# Patient Record
Sex: Female | Born: 1945 | Race: White | Hispanic: No | Smoking: Never smoker
Health system: Southern US, Community
[De-identification: ages and names within clinical notes are randomized; demographics above are authoritative.]

## PROBLEM LIST (undated history)

## (undated) DIAGNOSIS — M1990 Unspecified osteoarthritis, unspecified site: Secondary | ICD-10-CM

## (undated) DIAGNOSIS — K5790 Diverticulosis of intestine, part unspecified, without perforation or abscess without bleeding: Secondary | ICD-10-CM

## (undated) DIAGNOSIS — D259 Leiomyoma of uterus, unspecified: Secondary | ICD-10-CM

## (undated) DIAGNOSIS — C801 Malignant (primary) neoplasm, unspecified: Secondary | ICD-10-CM

## (undated) DIAGNOSIS — I639 Cerebral infarction, unspecified: Secondary | ICD-10-CM

## (undated) DIAGNOSIS — N6019 Diffuse cystic mastopathy of unspecified breast: Secondary | ICD-10-CM

## (undated) DIAGNOSIS — E785 Hyperlipidemia, unspecified: Secondary | ICD-10-CM

## (undated) DIAGNOSIS — I10 Essential (primary) hypertension: Secondary | ICD-10-CM

## (undated) HISTORY — PX: COLONOSCOPY: SHX174

## (undated) HISTORY — PX: TUBAL LIGATION: SHX77

## (undated) HISTORY — PX: OTHER SURGICAL HISTORY: SHX169

## (undated) HISTORY — PX: ANKLE ARTHROSCOPY W/ OPEN REPAIR: SHX1145

## (undated) HISTORY — PX: CARPAL TUNNEL RELEASE: SHX101

---

## 2003-10-08 ENCOUNTER — Encounter: Payer: Self-pay | Admitting: Internal Medicine

## 2003-11-08 ENCOUNTER — Encounter: Payer: Self-pay | Admitting: Internal Medicine

## 2003-12-08 ENCOUNTER — Encounter: Payer: Self-pay | Admitting: Internal Medicine

## 2004-01-08 ENCOUNTER — Encounter: Payer: Self-pay | Admitting: Internal Medicine

## 2004-02-08 ENCOUNTER — Encounter: Payer: Self-pay | Admitting: Internal Medicine

## 2004-03-07 ENCOUNTER — Encounter: Payer: Self-pay | Admitting: Internal Medicine

## 2004-03-15 ENCOUNTER — Ambulatory Visit: Payer: Self-pay | Admitting: Internal Medicine

## 2004-04-07 ENCOUNTER — Encounter: Payer: Self-pay | Admitting: Internal Medicine

## 2004-04-19 ENCOUNTER — Ambulatory Visit: Payer: Self-pay | Admitting: Internal Medicine

## 2004-05-07 ENCOUNTER — Encounter: Payer: Self-pay | Admitting: Internal Medicine

## 2004-05-10 ENCOUNTER — Ambulatory Visit: Payer: Self-pay | Admitting: Internal Medicine

## 2004-06-07 ENCOUNTER — Encounter: Payer: Self-pay | Admitting: Internal Medicine

## 2004-07-07 ENCOUNTER — Encounter: Payer: Self-pay | Admitting: Internal Medicine

## 2005-05-16 ENCOUNTER — Ambulatory Visit: Payer: Self-pay | Admitting: Internal Medicine

## 2006-05-20 ENCOUNTER — Ambulatory Visit: Payer: Self-pay | Admitting: Internal Medicine

## 2007-05-26 ENCOUNTER — Ambulatory Visit: Payer: Self-pay | Admitting: Internal Medicine

## 2007-06-02 ENCOUNTER — Ambulatory Visit: Payer: Self-pay | Admitting: Internal Medicine

## 2008-01-29 ENCOUNTER — Emergency Department: Payer: Self-pay | Admitting: Emergency Medicine

## 2008-06-07 ENCOUNTER — Ambulatory Visit: Payer: Self-pay | Admitting: Internal Medicine

## 2009-05-31 ENCOUNTER — Other Ambulatory Visit: Payer: Self-pay | Admitting: Internal Medicine

## 2009-06-01 ENCOUNTER — Ambulatory Visit: Payer: Self-pay | Admitting: Internal Medicine

## 2009-06-14 ENCOUNTER — Ambulatory Visit: Payer: Self-pay | Admitting: Internal Medicine

## 2009-10-07 ENCOUNTER — Ambulatory Visit: Payer: Self-pay | Admitting: Gynecologic Oncology

## 2009-10-10 ENCOUNTER — Ambulatory Visit: Payer: Self-pay | Admitting: Gynecologic Oncology

## 2009-11-07 ENCOUNTER — Ambulatory Visit: Payer: Self-pay | Admitting: Gynecologic Oncology

## 2010-06-11 ENCOUNTER — Inpatient Hospital Stay: Payer: Self-pay | Admitting: Specialist

## 2010-06-18 ENCOUNTER — Ambulatory Visit: Payer: Self-pay | Admitting: Internal Medicine

## 2010-08-22 ENCOUNTER — Ambulatory Visit: Payer: Self-pay | Admitting: Internal Medicine

## 2011-06-05 ENCOUNTER — Other Ambulatory Visit: Payer: Self-pay | Admitting: Internal Medicine

## 2011-06-05 LAB — COMPREHENSIVE METABOLIC PANEL
Alkaline Phosphatase: 74 U/L (ref 50–136)
Anion Gap: 7 (ref 7–16)
Bilirubin,Total: 0.4 mg/dL (ref 0.2–1.0)
Chloride: 104 mmol/L (ref 98–107)
EGFR (African American): >60
EGFR (Non-African Amer.): >60
Potassium: 4.2 mmol/L (ref 3.5–5.1)
SGOT(AST): 25 U/L (ref 15–37)
SGPT (ALT): 24 U/L
Sodium: 140 mmol/L (ref 136–145)
Total Protein: 7.4 g/dL (ref 6.4–8.2)

## 2011-06-05 LAB — CBC WITH DIFFERENTIAL/PLATELET
Basophil %: 2.6 %
HCT: 39 % (ref 35.0–47.0)
HGB: 12.9 g/dL (ref 12.0–16.0)
Lymphocyte #: 1.6 10*3/uL (ref 1.0–3.6)
Lymphocyte %: 31.1 %
MCHC: 33.2 g/dL (ref 32.0–36.0)
MCV: 87 fL (ref 80–100)
Monocyte #: 0.4 x10 3/mm (ref 0.2–0.9)
Neutrophil #: 2.9 10*3/uL (ref 1.4–6.5)
RBC: 4.5 10*6/uL (ref 3.80–5.20)

## 2011-06-05 LAB — LIPID PANEL
Cholesterol: 197 mg/dL (ref 0–200)
Ldl Cholesterol, Calc: 131 mg/dL — ABNORMAL HIGH (ref 0–100)
Triglycerides: 57 mg/dL (ref 0–200)
VLDL Cholesterol, Calc: 11 mg/dL (ref 5–40)

## 2011-06-05 LAB — URINALYSIS, COMPLETE
Bilirubin,UR: NEGATIVE
Glucose,UR: NEGATIVE mg/dL (ref 0–75)
Ph: 6 (ref 4.5–8.0)
Squamous Epithelial: 1
WBC UR: 3 /HPF (ref 0–5)

## 2011-06-05 LAB — TSH: Thyroid Stimulating Horm: 2.14 u[IU]/mL

## 2011-07-08 ENCOUNTER — Ambulatory Visit: Payer: Self-pay | Admitting: Internal Medicine

## 2011-08-23 ENCOUNTER — Ambulatory Visit: Payer: Self-pay | Admitting: Internal Medicine

## 2011-12-25 ENCOUNTER — Ambulatory Visit: Payer: Self-pay | Admitting: Gastroenterology

## 2011-12-26 LAB — PATHOLOGY REPORT

## 2012-08-25 ENCOUNTER — Ambulatory Visit: Payer: Self-pay | Admitting: Internal Medicine

## 2013-09-07 ENCOUNTER — Ambulatory Visit: Payer: Self-pay | Admitting: Internal Medicine

## 2014-08-09 ENCOUNTER — Other Ambulatory Visit: Payer: Self-pay | Admitting: Internal Medicine

## 2014-08-09 DIAGNOSIS — Z1231 Encounter for screening mammogram for malignant neoplasm of breast: Secondary | ICD-10-CM

## 2014-09-13 ENCOUNTER — Ambulatory Visit
Admission: RE | Admit: 2014-09-13 | Discharge: 2014-09-13 | Disposition: A | Discharge status: Home / Self Care | Payer: Medicare HMO | Source: Ambulatory Visit | Attending: Internal Medicine | Admitting: Internal Medicine

## 2014-09-13 ENCOUNTER — Other Ambulatory Visit: Payer: Self-pay | Admitting: Internal Medicine

## 2014-09-13 DIAGNOSIS — Z1231 Encounter for screening mammogram for malignant neoplasm of breast: Secondary | ICD-10-CM

## 2015-08-15 ENCOUNTER — Other Ambulatory Visit: Payer: Self-pay | Admitting: Internal Medicine

## 2015-08-15 DIAGNOSIS — Z1231 Encounter for screening mammogram for malignant neoplasm of breast: Secondary | ICD-10-CM

## 2015-09-19 ENCOUNTER — Ambulatory Visit: Payer: Medicare HMO

## 2015-10-05 ENCOUNTER — Other Ambulatory Visit: Payer: Self-pay | Admitting: Internal Medicine

## 2015-10-05 ENCOUNTER — Ambulatory Visit
Admission: RE | Admit: 2015-10-05 | Discharge: 2015-10-05 | Disposition: A | Discharge status: Home / Self Care | Payer: Medicare HMO | Source: Ambulatory Visit | Attending: Internal Medicine | Admitting: Internal Medicine

## 2015-10-05 DIAGNOSIS — Z1231 Encounter for screening mammogram for malignant neoplasm of breast: Secondary | ICD-10-CM

## 2016-08-13 ENCOUNTER — Other Ambulatory Visit: Payer: Self-pay | Admitting: Internal Medicine

## 2016-08-13 DIAGNOSIS — Z1231 Encounter for screening mammogram for malignant neoplasm of breast: Secondary | ICD-10-CM

## 2016-10-08 ENCOUNTER — Ambulatory Visit
Admission: RE | Admit: 2016-10-08 | Discharge: 2016-10-08 | Disposition: A | Discharge status: Home / Self Care | Payer: Medicare HMO | Source: Ambulatory Visit | Attending: Internal Medicine | Admitting: Internal Medicine

## 2016-10-08 DIAGNOSIS — Z1231 Encounter for screening mammogram for malignant neoplasm of breast: Secondary | ICD-10-CM | POA: Diagnosis present

## 2017-01-22 ENCOUNTER — Ambulatory Visit: Payer: Medicare HMO | Admitting: Certified Registered Nurse Anesthetist

## 2017-01-22 ENCOUNTER — Encounter: Payer: Self-pay | Admitting: *Deleted

## 2017-01-22 ENCOUNTER — Encounter
Admission: RE | Disposition: A | Discharge status: Home / Self Care | Payer: Self-pay | Source: Ambulatory Visit | Attending: Unknown Physician Specialty

## 2017-01-22 ENCOUNTER — Ambulatory Visit
Admission: RE | Admit: 2017-01-22 | Discharge: 2017-01-22 | Disposition: A | Discharge status: Home / Self Care | Payer: Medicare HMO | Source: Ambulatory Visit | Attending: Unknown Physician Specialty | Admitting: Unknown Physician Specialty

## 2017-01-22 DIAGNOSIS — Z7982 Long term (current) use of aspirin: Secondary | ICD-10-CM | POA: Diagnosis not present

## 2017-01-22 DIAGNOSIS — K640 First degree hemorrhoids: Secondary | ICD-10-CM | POA: Diagnosis not present

## 2017-01-22 DIAGNOSIS — Z79899 Other long term (current) drug therapy: Secondary | ICD-10-CM | POA: Insufficient documentation

## 2017-01-22 DIAGNOSIS — Z1211 Encounter for screening for malignant neoplasm of colon: Secondary | ICD-10-CM | POA: Insufficient documentation

## 2017-01-22 DIAGNOSIS — D122 Benign neoplasm of ascending colon: Secondary | ICD-10-CM | POA: Diagnosis not present

## 2017-01-22 DIAGNOSIS — K5730 Diverticulosis of large intestine without perforation or abscess without bleeding: Secondary | ICD-10-CM | POA: Diagnosis not present

## 2017-01-22 DIAGNOSIS — Z8371 Family history of colonic polyps: Secondary | ICD-10-CM | POA: Insufficient documentation

## 2017-01-22 DIAGNOSIS — I10 Essential (primary) hypertension: Secondary | ICD-10-CM | POA: Insufficient documentation

## 2017-01-22 DIAGNOSIS — D128 Benign neoplasm of rectum: Secondary | ICD-10-CM | POA: Diagnosis not present

## 2017-01-22 HISTORY — PX: COLONOSCOPY WITH PROPOFOL: SHX5780

## 2017-01-22 HISTORY — DX: Essential (primary) hypertension: I10

## 2017-01-22 HISTORY — DX: Diverticulosis of intestine, part unspecified, without perforation or abscess without bleeding: K57.90

## 2017-01-22 HISTORY — DX: Leiomyoma of uterus, unspecified: D25.9

## 2017-01-22 HISTORY — DX: Hyperlipidemia, unspecified: E78.5

## 2017-01-22 HISTORY — DX: Diffuse cystic mastopathy of unspecified breast: N60.19

## 2017-01-22 SURGERY — COLONOSCOPY WITH PROPOFOL
Anesthesia: General

## 2017-01-22 MED ORDER — SODIUM CHLORIDE 0.9 % IV SOLN: INTRAVENOUS | Status: DC

## 2017-01-22 MED ORDER — PROPOFOL 10 MG/ML IV BOLUS
INTRAVENOUS | Status: DC | PRN
  Administered 2017-01-22: 425 mg via INTRAVENOUS

## 2017-01-22 MED ORDER — LIDOCAINE HCL (CARDIAC) 20 MG/ML IV SOLN
INTRAVENOUS | Status: DC | PRN
  Administered 2017-01-22: 50 mg via INTRAVENOUS

## 2017-01-22 MED ORDER — SODIUM CHLORIDE 0.9 % IV SOLN
INTRAVENOUS | Status: DC
  Administered 2017-01-22: 1000 mL via INTRAVENOUS

## 2017-01-22 MED ORDER — LIDOCAINE HCL (PF) 2 % IJ SOLN
INTRAMUSCULAR | Status: AC
  Filled 2017-01-22: qty 10

## 2017-01-22 MED ORDER — PROPOFOL 500 MG/50ML IV EMUL
INTRAVENOUS | Status: AC
  Filled 2017-01-22: qty 50

## 2017-01-22 NOTE — Anesthesia Post-op Follow-up Note (Signed)
Anesthesia QCDR form completed.        

## 2017-01-22 NOTE — Anesthesia Postprocedure Evaluation (Signed)
Anesthesia Post Note  Patient: Karina Hoffman  Procedure(s) Performed: COLONOSCOPY WITH PROPOFOL (N/A )  Patient location during evaluation: Endoscopy Anesthesia Type: General Level of consciousness: awake and alert and oriented Pain management: pain level controlled Vital Signs Assessment: post-procedure vital signs reviewed and stable Respiratory status: spontaneous breathing, nonlabored ventilation and respiratory function stable Cardiovascular status: blood pressure returned to baseline and stable Postop Assessment: no signs of nausea or vomiting Anesthetic complications: no     Last Vitals:  Vitals:   01/22/17 1528 01/22/17 1538  BP:  132/70  Pulse:    Resp:    Temp:    SpO2: 97%     Last Pain:  Vitals:   01/22/17 1508  TempSrc: Tympanic                 Kelcie Currie

## 2017-01-22 NOTE — Op Note (Signed)
[facility] Patient Name: Karina Hoffman Procedure Date: 01/22/2017 2:23 PM MRN: [id number] Account #: [id number] Date of Birth: 10-Sep-1945 Admit Type: Outpatient Age: 72 Room: [facility] ENDO ROOM 4 Gender: Female Note Status: Finalized Procedure:            Colonoscopy Indications:          Colon cancer screening in patient at increased risk:                        Family history of 1st-degree relative with colon polyps Providers:            Manya Silvas, MD Referring MD:         Leonie Douglas. Doy Hutching, MD (Referring MD) Medicines:            Propofol per Anesthesia Complications:        No immediate complications. Procedure:            Pre-Anesthesia Assessment:                       - After reviewing the risks and benefits, the patient                        was deemed in satisfactory condition to undergo the                        procedure.                       After obtaining informed consent, the colonoscope was                        passed under direct vision. Throughout the procedure,                        the patient's blood pressure, pulse, and oxygen                        saturations were monitored continuously. The                        Colonoscope was introduced through the anus and                        advanced to the the cecum, identified by appendiceal                        orifice and ileocecal valve. The colonoscopy was                        technically difficult and complex due to multiple                        diverticula in the colon, restricted mobility of the                        colon, significant looping and a tortuous colon.                        Successful completion of the procedure was aided by  applying abdominal pressure. The patient tolerated the                        procedure well. The quality of the bowel preparation                        was good. Findings:      Multiple small  and large-mouthed diverticula were found in the       recto-sigmoid colon, sigmoid colon, descending colon, transverse colon       and ascending colon.      A small-medium polyp was found in the ascending colon. The polyp was       sessile. The polyp was removed with a hot snare. Resection and retrieval       were complete. One clip placed on the divot with great position.      A small polyp was found in the rectum. The polyp was sessile. The polyp       was removed with a hot snare. Resection and retrieval were complete.      Internal hemorrhoids were found during endoscopy. The hemorrhoids were       small and Grade I (internal hemorrhoids that do not prolapse). Impression:           - Diverticulosis in the recto-sigmoid colon, in the                        sigmoid colon, in the descending colon, in the                        transverse colon and in the ascending colon.                       - One medium polyp in the ascending colon, removed with                        a hot snare. Resected and retrieved.                       - One small polyp in the rectum, removed with a hot                        snare. Resected and retrieved.                       - Internal hemorrhoids. Recommendation:       - Await pathology results. Manya Silvas, MD 01/22/2017 3:10:53 PM This report has been signed electronically. Number of Addenda: 0 Note Initiated On: 01/22/2017 2:23 PM Scope Withdrawal Time: 0 hours 17 minutes 36 seconds  Total Procedure Duration: 0 hours 38 minutes 40 seconds       [facility]

## 2017-01-22 NOTE — H&P (Signed)
   Primary Care Physician:  Idelle Crouch, MD Primary Gastroenterologist:  Dr. Vira Agar  Pre-Procedure History & Physical: HPI:  Karina Hoffman is a 72 y.o. female is here for an colonoscopy.   Past Medical History:  Diagnosis Date  . Diverticulosis   . Fibrocystic breast disease   . Hyperlipidemia   . Hypertension   . Uterine fibroid     Past Surgical History:  Procedure Laterality Date  . ANKLE ARTHROSCOPY W/ OPEN REPAIR    . COLONOSCOPY    . TUBAL LIGATION      Prior to Admission medications   Medication Sig Start Date End Date Taking? Authorizing Provider  aspirin EC 81 MG tablet Take 81 mg by mouth daily.   Yes [provider]  calcium carbonate 1250 MG capsule Take 1,250 mg by mouth daily.   Yes [provider]  folic acid (FOLVITE) 1 MG tablet Take 1 mg by mouth daily.   Yes [provider]  losartan (COZAAR) 50 MG tablet Take 50 mg by mouth daily.   Yes [provider]    Allergies as of 01/21/2017  . (Not on File)    Family History  Problem Relation Age of Onset  . Breast cancer Neg Hx     Social History   Socioeconomic History  . Marital status: Widowed    Spouse name: Not on file  . Number of children: Not on file  . Years of education: Not on file  . Highest education level: Not on file  Social Needs  . Financial resource strain: Not on file  . Food insecurity - worry: Not on file  . Food insecurity - inability: Not on file  . Transportation needs - medical: Not on file  . Transportation needs - non-medical: Not on file  Occupational History  . Not on file  Tobacco Use  . Smoking status: Never Smoker  . Smokeless tobacco: Never Used  Substance and Sexual Activity  . Alcohol use: Not on file  . Drug use: Not on file  . Sexual activity: Not on file  Other Topics Concern  . Not on file  Social History Narrative  . Not on file    Review of Systems: See HPI, otherwise negative ROS  Physical Exam: BP  140/86   Pulse (!) 106   Temp (!) 97.3 F (36.3 C) (Tympanic)   Resp 16   Ht 5\' 2"  (1.575 m)   Wt 64.4 kg (142 lb)   SpO2 99%   BMI 25.97 kg/m  General:   Alert,  pleasant and cooperative in NAD Head:  Normocephalic and atraumatic. Neck:  Supple; no masses or thyromegaly. Lungs:  Clear throughout to auscultation.    Heart:  Regular rate and rhythm. Abdomen:  Soft, nontender and nondistended. Normal bowel sounds, without guarding, and without rebound.   Neurologic:  Alert and  oriented x4;  grossly normal neurologically.  Impression/Plan: Karina Hoffman is here for an colonoscopy to be performed for FH of colon polyps.  Risks, benefits, limitations, and alternatives regarding  colonoscopy have been reviewed with the patient.  Questions have been answered.  All parties agreeable.   Gaylyn Cheers, MD  01/22/2017, 2:20 PM

## 2017-01-22 NOTE — Anesthesia Preprocedure Evaluation (Signed)
Anesthesia Evaluation  Patient identified by MRN, date of birth, ID band Patient awake    Reviewed: Allergy & Precautions, H&P , NPO status , Patient's Chart, lab work & pertinent test results, reviewed documented beta blocker date and time   History of Anesthesia Complications Negative for: history of anesthetic complications  Airway Mallampati: I  TM Distance: >3 FB Neck ROM: full    Dental  (+) Dental Advidsory Given Permanent bridge x 2:   Pulmonary neg pulmonary ROS,           Cardiovascular Exercise Tolerance: Good hypertension, (-) angina(-) CAD, (-) Past MI, (-) Cardiac Stents and (-) CABG (-) dysrhythmias (-) Valvular Problems/Murmurs     Neuro/Psych negative neurological ROS  negative psych ROS   GI/Hepatic negative GI ROS, Neg liver ROS,   Endo/Other  negative endocrine ROS  Renal/GU negative Renal ROS  negative genitourinary   Musculoskeletal   Abdominal   Peds  Hematology negative hematology ROS (+)   Anesthesia Other Findings Past Medical History: No date: Diverticulosis No date: Fibrocystic breast disease No date: Hyperlipidemia No date: Hypertension No date: Uterine fibroid   Reproductive/Obstetrics negative OB ROS                             Anesthesia Physical Anesthesia Plan  ASA: II  Anesthesia Plan: General   Post-op Pain Management:    Induction: Intravenous  PONV Risk Score and Plan: 3 and Propofol infusion  Airway Management Planned: Nasal Cannula  Additional Equipment:   Intra-op Plan:   Post-operative Plan:   Informed Consent: I have reviewed the patients History and Physical, chart, labs and discussed the procedure including the risks, benefits and alternatives for the proposed anesthesia with the patient or authorized representative who has indicated his/her understanding and acceptance.   Dental Advisory Given  Plan Discussed with:  Anesthesiologist, CRNA and Surgeon  Anesthesia Plan Comments:         Anesthesia Quick Evaluation

## 2017-01-22 NOTE — Transfer of Care (Signed)
Immediate Anesthesia Transfer of Care Note  Patient: Karina Hoffman  Procedure(s) Performed: COLONOSCOPY WITH PROPOFOL (N/A )  Patient Location: PACU and Endoscopy Unit  Anesthesia Type:MAC  Level of Consciousness: awake, alert , oriented and patient cooperative  Airway & Oxygen Therapy: Patient Spontanous Breathing and Patient connected to nasal cannula oxygen  Post-op Assessment: Report given to RN and Post -op Vital signs reviewed and stable  Post vital signs: Reviewed and stable  Last Vitals:  Vitals:   01/22/17 1340 01/22/17 1508  BP: 140/86 115/70  Pulse: (!) 106   Resp: 16 16  Temp: (!) 36.3 C (!) 36.2 C  SpO2: 99% 99%    Last Pain:  Vitals:   01/22/17 1508  TempSrc: Tympanic         Complications: No apparent anesthesia complications

## 2017-01-23 ENCOUNTER — Encounter: Payer: Self-pay | Admitting: Unknown Physician Specialty

## 2017-01-24 LAB — SURGICAL PATHOLOGY

## 2017-08-28 ENCOUNTER — Other Ambulatory Visit: Payer: Self-pay | Admitting: Internal Medicine

## 2017-08-28 DIAGNOSIS — Z1231 Encounter for screening mammogram for malignant neoplasm of breast: Secondary | ICD-10-CM

## 2017-10-30 ENCOUNTER — Ambulatory Visit
Admission: RE | Admit: 2017-10-30 | Discharge: 2017-10-30 | Disposition: A | Discharge status: Home / Self Care | Payer: Medicare HMO | Source: Ambulatory Visit | Attending: Internal Medicine | Admitting: Internal Medicine

## 2017-10-30 DIAGNOSIS — Z1231 Encounter for screening mammogram for malignant neoplasm of breast: Secondary | ICD-10-CM | POA: Insufficient documentation

## 2018-10-20 ENCOUNTER — Other Ambulatory Visit: Payer: Self-pay | Admitting: Internal Medicine

## 2018-10-20 DIAGNOSIS — Z1231 Encounter for screening mammogram for malignant neoplasm of breast: Secondary | ICD-10-CM

## 2019-01-14 ENCOUNTER — Ambulatory Visit
Admission: RE | Admit: 2019-01-14 | Discharge: 2019-01-14 | Disposition: A | Discharge status: Home / Self Care | Payer: Medicare HMO | Source: Ambulatory Visit | Attending: Internal Medicine | Admitting: Internal Medicine

## 2019-01-14 DIAGNOSIS — Z1231 Encounter for screening mammogram for malignant neoplasm of breast: Secondary | ICD-10-CM | POA: Diagnosis present

## 2019-01-14 HISTORY — DX: Malignant (primary) neoplasm, unspecified: C80.1

## 2019-12-07 ENCOUNTER — Other Ambulatory Visit: Payer: Self-pay | Admitting: Internal Medicine

## 2019-12-07 DIAGNOSIS — Z1231 Encounter for screening mammogram for malignant neoplasm of breast: Secondary | ICD-10-CM

## 2020-01-18 ENCOUNTER — Other Ambulatory Visit: Payer: Self-pay

## 2020-01-18 ENCOUNTER — Ambulatory Visit
Admission: RE | Admit: 2020-01-18 | Discharge: 2020-01-18 | Disposition: A | Discharge status: Home / Self Care | Payer: Medicare HMO | Source: Ambulatory Visit | Attending: Internal Medicine | Admitting: Internal Medicine

## 2020-01-18 DIAGNOSIS — Z1231 Encounter for screening mammogram for malignant neoplasm of breast: Secondary | ICD-10-CM | POA: Diagnosis not present

## 2020-04-06 ENCOUNTER — Other Ambulatory Visit: Payer: Medicare HMO

## 2020-04-10 ENCOUNTER — Ambulatory Visit: Admit: 2020-04-10 | Payer: Medicare HMO | Admitting: Internal Medicine

## 2020-04-10 SURGERY — COLONOSCOPY WITH PROPOFOL
Anesthesia: General

## 2020-05-15 ENCOUNTER — Other Ambulatory Visit: Admission: RE | Admit: 2020-05-15 | Payer: Medicare HMO | Source: Ambulatory Visit

## 2020-08-18 ENCOUNTER — Other Ambulatory Visit: Payer: Self-pay | Admitting: Internal Medicine

## 2020-08-18 DIAGNOSIS — H539 Unspecified visual disturbance: Secondary | ICD-10-CM

## 2020-08-26 ENCOUNTER — Ambulatory Visit
Admission: RE | Admit: 2020-08-26 | Discharge: 2020-08-26 | Disposition: A | Discharge status: Home / Self Care | Payer: Medicare HMO | Source: Ambulatory Visit | Attending: Internal Medicine | Admitting: Internal Medicine

## 2020-08-26 ENCOUNTER — Other Ambulatory Visit: Payer: Self-pay

## 2020-08-26 DIAGNOSIS — H539 Unspecified visual disturbance: Secondary | ICD-10-CM

## 2020-08-26 DIAGNOSIS — I69398 Other sequelae of cerebral infarction: Secondary | ICD-10-CM | POA: Insufficient documentation

## 2020-08-29 ENCOUNTER — Encounter: Payer: Self-pay | Admitting: Internal Medicine

## 2020-08-30 ENCOUNTER — Encounter
Admission: RE | Disposition: A | Discharge status: Home / Self Care | Payer: Self-pay | Source: Home / Self Care | Attending: Internal Medicine

## 2020-08-30 ENCOUNTER — Encounter: Payer: Self-pay | Admitting: Internal Medicine

## 2020-08-30 ENCOUNTER — Other Ambulatory Visit: Payer: Self-pay

## 2020-08-30 ENCOUNTER — Ambulatory Visit: Payer: Medicare HMO | Admitting: Certified Registered Nurse Anesthetist

## 2020-08-30 ENCOUNTER — Ambulatory Visit
Admission: RE | Admit: 2020-08-30 | Discharge: 2020-08-30 | Disposition: A | Discharge status: Home / Self Care | Payer: Medicare HMO | Attending: Internal Medicine | Admitting: Internal Medicine

## 2020-08-30 DIAGNOSIS — Z79899 Other long term (current) drug therapy: Secondary | ICD-10-CM | POA: Insufficient documentation

## 2020-08-30 DIAGNOSIS — K5730 Diverticulosis of large intestine without perforation or abscess without bleeding: Secondary | ICD-10-CM | POA: Diagnosis not present

## 2020-08-30 DIAGNOSIS — Z86010 Personal history of colonic polyps: Secondary | ICD-10-CM | POA: Diagnosis not present

## 2020-08-30 DIAGNOSIS — Z7982 Long term (current) use of aspirin: Secondary | ICD-10-CM | POA: Insufficient documentation

## 2020-08-30 DIAGNOSIS — Z1211 Encounter for screening for malignant neoplasm of colon: Secondary | ICD-10-CM | POA: Insufficient documentation

## 2020-08-30 HISTORY — PX: COLONOSCOPY: SHX5424

## 2020-08-30 HISTORY — DX: Cerebral infarction, unspecified: I63.9

## 2020-08-30 HISTORY — DX: Unspecified osteoarthritis, unspecified site: M19.90

## 2020-08-30 SURGERY — COLONOSCOPY
Anesthesia: General

## 2020-08-30 MED ORDER — PROPOFOL 500 MG/50ML IV EMUL
INTRAVENOUS | Status: DC | PRN
  Administered 2020-08-30: 140 ug/kg/min via INTRAVENOUS

## 2020-08-30 MED ORDER — PROPOFOL 10 MG/ML IV BOLUS
INTRAVENOUS | Status: DC | PRN
  Administered 2020-08-30: 50 mg via INTRAVENOUS
  Administered 2020-08-30: 30 mg via INTRAVENOUS

## 2020-08-30 MED ORDER — SODIUM CHLORIDE 0.9 % IV SOLN: INTRAVENOUS | Status: DC

## 2020-08-30 NOTE — Transfer of Care (Signed)
Immediate Anesthesia Transfer of Care Note  Patient: Karina Hoffman  Procedure(s) Performed: COLONOSCOPY  Patient Location: PACU  Anesthesia Type:General  Level of Consciousness: drowsy  Airway & Oxygen Therapy: Patient Spontanous Breathing and Patient connected to nasal cannula oxygen  Post-op Assessment: Report given to RN and Post -op Vital signs reviewed and stable  Post vital signs: Reviewed and stable  Last Vitals:  Vitals Value Taken Time  BP 103/77 08/30/20 1307  Temp 35.9 C 08/30/20 1307  Pulse 57 08/30/20 1310  Resp 15 08/30/20 1310  SpO2 99 % 08/30/20 1310  Vitals shown include unvalidated device data.  Last Pain:  Vitals:   08/30/20 1307  TempSrc:   PainSc: Asleep         Complications: No notable events documented.

## 2020-08-30 NOTE — Op Note (Signed)
[facility] Patient Name: Karina Hoffman Procedure Date: 08/30/2020 12:30 PM MRN: [id number] Account #: [id number] Date of Birth: 1945-02-18 Admit Type: Outpatient Age: 75 Room: [facility] ENDO ROOM 2 Gender: Female Note Status: Finalized Procedure:             Colonoscopy Indications:           Surveillance: Personal history of adenomatous polyps                         on last colonoscopy > 3 years ago Providers:             Lorie Apley K. Alice Reichert MD, MD Referring MD:          Leonie Douglas. Doy Hutching, MD (Referring MD) Medicines:             Propofol per Anesthesia Complications:         No immediate complications. Procedure:             Pre-Anesthesia Assessment:                        - The risks and benefits of the procedure and the                         sedation options and risks were discussed with the                         patient. All questions were answered and informed                         consent was obtained.                        - Patient identification and proposed procedure were                         verified prior to the procedure by the nurse. The                         procedure was verified in the procedure room.                        - ASA Grade Assessment: III - A patient with severe                         systemic disease.                        - After reviewing the risks and benefits, the patient                         was deemed in satisfactory condition to undergo the                         procedure.                        After obtaining informed consent, the colonoscope was                         passed under direct  vision. Throughout the procedure,                         the patient's blood pressure, pulse, and oxygen                         saturations were monitored continuously. The                         Colonoscope was introduced through the anus and                         advanced to the the cecum,  identified by appendiceal                         orifice and ileocecal valve. The colonoscopy was                         somewhat difficult due to multiple diverticula in the                         colon and restricted mobility of the colon. Successful                         completion of the procedure was aided by straightening                         and shortening the scope to obtain bowel loop                         reduction. The patient tolerated the procedure well.                         The quality of the bowel preparation was good. The                         ileocecal valve, appendiceal orifice, and rectum were                         photographed. Findings:      The perianal and digital rectal examinations were normal. Pertinent       negatives include normal sphincter tone and no palpable rectal lesions.      Multiple small and large-mouthed diverticula were found in the sigmoid       colon. There was no evidence of diverticular bleeding.      A few small-mouthed diverticula were found in the descending colon,       transverse colon and ascending colon. There was no evidence of       diverticular bleeding.      The exam was otherwise without abnormality on direct and retroflexion       views. Impression:            - Severe diverticulosis in the sigmoid colon. There                         was no evidence of diverticular bleeding.                        -  Mild diverticulosis in the descending colon, in the                         transverse colon and in the ascending colon. There was                         no evidence of diverticular bleeding.                        - The examination was otherwise normal on direct and                         retroflexion views.                        - No specimens collected. Recommendation:        - Patient has a contact number available for                         emergencies. The signs and symptoms of potential                          delayed complications were discussed with the patient.                         Return to normal activities tomorrow. Written                         discharge instructions were provided to the patient.                        - High fiber diet.                        - Continue present medications.                        - You do NOT require further colon cancer screening                         measures (Annual stool testing (i.e. hemoccult, FIT,                         cologuard), sigmoidoscopy, colonoscopy or CT                         colonography). You should share this recommendation                         with your Primary Care provider.                        - Return to GI office PRN. Procedure Code(s):     --- Professional ---                        [id number], Colorectal cancer screening; colonoscopy on                         individual at high risk Diagnosis Code(s):     ---  Professional ---                        K57.30, Diverticulosis of large intestine without                         perforation or abscess without bleeding                        Z86.010, Personal history of colonic polyps CPT copyright 2019 American Medical Association. All rights reserved. The codes documented in this report are preliminary and upon coder review may  be revised to meet current compliance requirements. Efrain Sella MD, MD 08/30/2020 1:09:54 PM This report has been signed electronically. Number of Addenda: 0 Note Initiated On: 08/30/2020 12:30 PM Scope Withdrawal Time: 0 hours 6 minutes 2 seconds  Total Procedure Duration: 0 hours 14 minutes 11 seconds  Estimated Blood Loss:  Estimated blood loss: none.      [employer]

## 2020-08-30 NOTE — H&P (Signed)
Outpatient short stay form Pre-procedure 08/30/2020 12:23 PM Darol Cush K. Alice Reichert, M.D.  Primary Physician: Fulton Reek, M.D.  Reason for visit:  Personal history of adenomatous colon polyps (2019)  History of present illness:                            Patient presents for colonoscopy for a personal hx of colon polyps. The patient denies abdominal pain, abnormal weight loss or rectal bleeding.      Current Facility-Administered Medications:    0.9 %  sodium chloride infusion, , Intravenous, Continuous, Fairley Copher, Benay Pike, MD  Medications Prior to Admission  Medication Sig Dispense Refill Last Dose   aspirin EC 81 MG tablet Take 81 mg by mouth daily.   08/29/2020   calcium carbonate 1250 MG capsule Take 1,250 mg by mouth daily.   [id number]   folic acid (FOLVITE) 1 MG tablet Take 1 mg by mouth daily.   08/28/2020   losartan (COZAAR) 50 MG tablet Take 50 mg by mouth daily.   08/30/2020 at 0600   traZODone (DESYREL) 50 MG tablet Take 50 mg by mouth at bedtime.   08/29/2020 at 2100   telmisartan (MICARDIS) 80 MG tablet Take 80 mg by mouth daily. (Patient not taking: Reported on 08/30/2020)   Not Taking     No Known Allergies   Past Medical History:  Diagnosis Date   Arthritis    Cancer ([place])    Melanoma   Diverticulosis    Fibrocystic breast disease    Hyperlipidemia    Hypertension    Stroke [facility])    Uterine fibroid     Review of systems:  Otherwise negative.    Physical Exam  Gen: Alert, oriented. Appears stated age.  HEENT: [place]/AT. PERRLA. Lungs: CTA, no wheezes. CV: RR nl S1, S2. Abd: soft, benign, no masses. BS+ Ext: No edema. Pulses 2+    Planned procedures: Proceed with colonoscopy. The patient understands the nature of the planned procedure, indications, risks, alternatives and potential complications including but not limited to bleeding, infection, perforation, damage to internal organs and possible oversedation/side effects from anesthesia. The patient agrees  and gives consent to proceed.  Please refer to procedure notes for findings, recommendations and patient disposition/instructions.     Romolo Sieling K. Alice Reichert, M.D. Gastroenterology 08/30/2020  12:23 PM

## 2020-08-30 NOTE — Anesthesia Preprocedure Evaluation (Signed)
Anesthesia Evaluation  Patient identified by MRN, date of birth, ID band Patient awake    Reviewed: Allergy & Precautions, H&P , NPO status , Patient's Chart, lab work & pertinent test results, reviewed documented beta blocker date and time   History of Anesthesia Complications Negative for: history of anesthetic complications  Airway Mallampati: I  TM Distance: >3 FB Neck ROM: full    Dental  (+) Dental Advidsory Given Permanent bridge x 2:   Pulmonary neg pulmonary ROS,    Pulmonary exam normal        Cardiovascular Exercise Tolerance: Good hypertension, (-) angina(-) CAD, (-) Past MI, (-) Cardiac Stents and (-) CABG Normal cardiovascular exam(-) dysrhythmias (-) Valvular Problems/Murmurs     Neuro/Psych 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), Residual Symptoms negative psych ROS   GI/Hepatic negative GI ROS, Neg liver ROS,   Endo/Other  negative endocrine ROS  Renal/GU negative Renal ROS  negative genitourinary   Musculoskeletal   Abdominal Normal abdominal exam  (+)   Peds  Hematology negative hematology ROS (+)   Anesthesia Other Findings Past Medical History: No date: Diverticulosis No date: Fibrocystic breast disease No date: Hyperlipidemia No date: Hypertension No date: Uterine fibroid   Reproductive/Obstetrics negative OB ROS                             Anesthesia Physical  Anesthesia Plan  ASA: 3  Anesthesia Plan: General   Post-op Pain Management:    Induction: Intravenous  PONV Risk Score and Plan: 3 and Propofol infusion  Airway Management Planned: Nasal Cannula  Additional Equipment:   Intra-op Plan:   Post-operative Plan:   Informed Consent: I have reviewed the patients History and Physical, chart, labs and discussed the procedure including the risks, benefits and alternatives for the proposed anesthesia with the patient or authorized  representative who has indicated his/her understanding and acceptance.     Dental Advisory Given  Plan Discussed with: Anesthesiologist, CRNA and Surgeon  Anesthesia Plan Comments:         Anesthesia Quick Evaluation

## 2020-08-30 NOTE — Interval H&P Note (Signed)
History and Physical Interval Note:  08/30/2020 12:24 PM  Karina Hoffman  has presented today for surgery, with the diagnosis of PERSONEL HX.OF COLON POLYPS.  The various methods of treatment have been discussed with the patient and family. After consideration of risks, benefits and other options for treatment, the patient has consented to  Procedure(s): COLONOSCOPY (N/A) as a surgical intervention.  The patient's history has been reviewed, patient examined, no change in status, stable for surgery.  I have reviewed the patient's chart and labs.  Questions were answered to the patient's satisfaction.     Primera, Bonnie

## 2020-08-31 ENCOUNTER — Encounter: Payer: Self-pay | Admitting: Internal Medicine

## 2020-08-31 NOTE — Anesthesia Postprocedure Evaluation (Signed)
Anesthesia Post Note  Patient: Karina Hoffman  Procedure(s) Performed: COLONOSCOPY  Patient location during evaluation: Endoscopy Anesthesia Type: General Level of consciousness: awake and alert Pain management: pain level controlled Vital Signs Assessment: post-procedure vital signs reviewed and stable Respiratory status: spontaneous breathing, nonlabored ventilation and respiratory function stable Cardiovascular status: blood pressure returned to baseline and stable Postop Assessment: no apparent nausea or vomiting Anesthetic complications: no   No notable events documented.   Last Vitals:  Vitals:   08/30/20 1327 08/30/20 1337  BP: (!) 151/84 (!) 150/87  Pulse: (!) 56 66  Resp: 15 17  Temp:    SpO2: 100% 100%    Last Pain:  Vitals:   08/31/20 0759  TempSrc:   PainSc: 0-No pain                 [place] Ouch

## 2020-12-13 ENCOUNTER — Other Ambulatory Visit: Payer: Self-pay | Admitting: Internal Medicine

## 2020-12-13 DIAGNOSIS — Z1231 Encounter for screening mammogram for malignant neoplasm of breast: Secondary | ICD-10-CM

## 2021-01-19 ENCOUNTER — Other Ambulatory Visit: Payer: Self-pay

## 2021-01-19 ENCOUNTER — Ambulatory Visit
Admission: RE | Admit: 2021-01-19 | Discharge: 2021-01-19 | Disposition: A | Discharge status: Home / Self Care | Payer: Medicare HMO | Source: Ambulatory Visit | Attending: Internal Medicine | Admitting: Internal Medicine

## 2021-01-19 DIAGNOSIS — Z1231 Encounter for screening mammogram for malignant neoplasm of breast: Secondary | ICD-10-CM | POA: Diagnosis present

## 2021-10-22 ENCOUNTER — Ambulatory Visit: Payer: Medicare HMO | Attending: Orthopedic Surgery | Admitting: Physical Therapy

## 2021-10-22 DIAGNOSIS — M25662 Stiffness of left knee, not elsewhere classified: Secondary | ICD-10-CM | POA: Diagnosis present

## 2021-10-22 DIAGNOSIS — M6281 Muscle weakness (generalized): Secondary | ICD-10-CM | POA: Insufficient documentation

## 2021-10-22 DIAGNOSIS — M25562 Pain in left knee: Secondary | ICD-10-CM | POA: Diagnosis present

## 2021-10-22 DIAGNOSIS — G8929 Other chronic pain: Secondary | ICD-10-CM | POA: Diagnosis present

## 2021-10-29 ENCOUNTER — Ambulatory Visit: Payer: Medicare HMO | Admitting: Physical Therapy

## 2021-10-29 DIAGNOSIS — M25662 Stiffness of left knee, not elsewhere classified: Secondary | ICD-10-CM

## 2021-10-29 DIAGNOSIS — M25562 Pain in left knee: Secondary | ICD-10-CM | POA: Diagnosis not present

## 2021-10-29 DIAGNOSIS — M6281 Muscle weakness (generalized): Secondary | ICD-10-CM

## 2021-10-29 DIAGNOSIS — G8929 Other chronic pain: Secondary | ICD-10-CM

## 2021-10-29 NOTE — Addendum Note (Signed)
Addended by: Pura Spice on: 10/29/2021 07:12 PM   Modules accepted: Orders

## 2021-10-29 NOTE — Therapy (Signed)
OUTPATIENT PHYSICAL THERAPY LOWER EXTREMITY EVALUATION   Patient Name: Karina Hoffman MRN: [id number] DOB:[birth year], 76 y.o., female Today's Date: 10/22/21   PT End of Session - 10/29/21 1843     Visit Number 1    Number of Visits 13    Date for PT Re-Evaluation 12/03/21    PT Start Time 0939    PT Stop Time 1031    PT Time Calculation (min) 52 min             Past Medical History:  Diagnosis Date   Arthritis    Cancer ([place])    Melanoma   Diverticulosis    Fibrocystic breast disease    Hyperlipidemia    Hypertension    Stroke [facility])    Uterine fibroid    Past Surgical History:  Procedure Laterality Date   ANKLE ARTHROSCOPY W/ OPEN REPAIR     CARPAL TUNNEL RELEASE Right    COLONOSCOPY     COLONOSCOPY N/A 08/30/2020   Procedure: COLONOSCOPY;  Surgeon: Toledo, Benay Pike, MD;  Location: [facility] ENDOSCOPY;  Service: Gastroenterology;  Laterality: N/A;   COLONOSCOPY WITH PROPOFOL N/A 01/22/2017   Procedure: COLONOSCOPY WITH PROPOFOL;  Surgeon: Manya Silvas, MD;  Location: [facility] ENDOSCOPY;  Service: Endoscopy;  Laterality: N/A;   triger finger release Right    TUBAL LIGATION     There are no problems to display for this patient.   PCP: Idelle Crouch, MD   REFERRING PROVIDER: Fausto Skillern, PA-C  REFERRING DIAG: Iliotibial band tendinitis of left side  Primary osteoarthritis of left knee (lateral compartment)   THERAPY DIAG:  Chronic pain of left knee  Joint stiffness of knee, left  Muscle weakness (generalized)  Rationale for Evaluation and Treatment Rehabilitation  ONSET DATE: 04/07/21  SUBJECTIVE:   SUBJECTIVE STATEMENT: Pt. States she was diagnosed with RA 18 months ago.  Pt. Received a cortisone injection in L knee 3 months ago with minimal benefit.  Pt. Reports several episodes of L knee giving away and walks around 2 miles/ day and complete work-out video everyday.    PERTINENT HISTORY: Chief Complaint  Patient presents with  Knee  Pain  LEFT KNEE PAIN   History of Present Illness:   Karina Hoffman is a 76 y.o. female that presents to clinic today for evaluation and management of left knee pain. Patient does have a history of rheumatoid arthritis with positive rheumatoid factor. Currently taking methotrexate. Received injection to the left infrapatellar bursa on 09/27/2021.  Her pain began around 6 months ago. The pain is located along the anterolateral aspect of the knee. She describes her pain as sharp and intermittent. Reports start up pain and stiffness. She reports associated feeling of giving way but no falls, some swelling in the knee. She denies associated locking. She reports minimal relief from the bursa injections.   Past Medical, Surgical, Family, Social History, Allergies, Medications:  Past Medical History:  Past Medical History:  Diagnosis Date  Carpal tunnel syndrome  Diverticulosis  Fibrocystic breast disease  Hyperlipidemia  Hypertension  Menopausal syndrome  Other nonspecific abnormal finding  S/P trigger finger release  Uterine fibroid   Past Surgical History:  Past Surgical History:  Procedure Laterality Date  COLONOSCOPY 01/22/2017  Adenomatous Polyps, FH Colon Polyps (Sister): CBF 01/2020  COLONOSCOPY 08/30/2020  Diverticulosis/PHx CP/No repeat due to age/TKT  COLONOSCOPY 12/25/2011, 02/23/2002  FH Colon Polyps (Sister)  TUBAL LIGATION   Current Medications:  Current Outpatient Medications  Medication Sig Dispense Refill  aspirin 81 MG EC tablet Take 81 mg by mouth once daily.  calcium carbonate 500 mg calcium (1,250 mg) tablet Take 500 mg of elemental by mouth once daily.  ezetimibe (ZETIA) 10 mg tablet Take 1 tablet by mouth once daily 90 tablet 0  folic acid (FOLVITE) 1 MG tablet TAKE 1 TABLET BY MOUTH EVERY DAY 90 tablet 1  meloxicam (MOBIC) 7.5 MG tablet Take 7.5 mg by mouth once daily as needed for Pain  methotrexate (RHEUMATREX) 2.5 MG tablet TAKE 6 TABLETS (15 MG TOTAL) BY  MOUTH EVERY 7 (SEVEN) DAYS WITH A MEAL 72 tablet 1  propranoloL (INDERAL LA) 60 MG LA capsule Take 1 capsule by mouth once daily 90 capsule 0  traZODone (DESYREL) 50 MG tablet TAKE 1 TABLET BY MOUTH EVERYDAY AT BEDTIME 90 tablet 1  Patient ambulates unassisted. Patient able to actively flex the left hip. No pain with passive internal and external rotation of the hip.   left Knee Edema: Mild Effusion: None Patella push test: no crepitus and mild pain  Tenderness: Medial joint line and Lateral joint line , over Gerdy's tubercle Stability: lateral pseudolaxity Anterior drawer: no laxity  Posterior drawer: no laxity  McMurray: weakly positive laterally ROM: 0/0/138  The patient is able to plantarflex and dorsiflex the left ankle.   Patient is able to flex and extend the left hallux. Sensation is intact over the saphenous, lateral sural cutaneous, superficial fibular, and deep fibular nerve distributions. Dorsalis pedis, posterior tibialis 2+.  Tests Performed/Reviewed:   X-ray knee left 3 views  Result Date: 10/16/2021 Anteroposterior, lateral, and sunrise views of the left knee were obtained. Images reveal mild loss of lateral compartment joint space with mild osteophyte formation noted. Medial compartment appears relatively well-preserved. Mild loss of patellofemoral joint space noted.   PA flex view of the left knee shows moderate loss of lateral compartment joint space with mild osteophyte formation.   PAIN:  Are you having pain? Yes: NPRS scale: 0/10 Pain location: L knee Pain description: aching Aggravating factors: increase activity/ walking Relieving factors: rest  PRECAUTIONS: None  WEIGHT BEARING RESTRICTIONS: No  FALLS:  Has patient fallen in last 6 months? No  LIVING ENVIRONMENT: Lives with: lives alone Lives in: House/apartment Has following equipment at home: None  OCCUPATION: Retired.  Lives close to sister and daughter.    PLOF: Independent  PATIENT  GOALS: Increase L knee ROM/ strength to improve pain-free mobility/walking.     OBJECTIVE:   DIAGNOSTIC FINDINGS: See above MD note  PATIENT SURVEYS:  FOTO initial 61/ goal 59  COGNITION: Overall cognitive status: Within functional limits for tasks assessed     SENSATION: WFL  EDEMA:  Circumferential: L/R knee joint line (39/38 cm.), mid-calf (35.5/36 dm.), distal quad (41/41 cm).  MUSCLE LENGTH: Hamstrings: Right 67 deg; Left 67 deg  POSTURE: rounded shoulders (slight)  PALPATION: Moderate L knee warmth as compared to R knee.  L lateral knee tenderness.  (+) L ITB tenderness noted with good ROM as compared to R.    LOWER EXTREMITY ROM:  B LE AROM WNL except L knee AROM (131 deg. Guarded/pain).  R knee flexion 138 deg.  B knee extension 0 deg.   LOWER EXTREMITY MMT:  MMT Right eval Left eval  Hip flexion 4+ 4  Hip extension 4+ 4+  Hip abduction    Hip adduction 4 4  Hip internal rotation 4+ 4+  Hip external rotation 4+ 4+  Knee flexion 5 4+  Knee extension 5  4 pain  Ankle dorsiflexion 5 5  Ankle plantarflexion    Ankle inversion    Ankle eversion     (Blank rows = not tested)  LOWER EXTREMITY SPECIAL TESTS:  Knee special tests: Anterior drawer test: negative, Posterior drawer test: negative, McMurray's test: negative, and Steinmanns test: negative  (-) Ober test (bilateral).  (-) Fair test.  FUNCTIONAL TESTS:  5 times sit to stand: TBD  GAIT: Distance walked: in clinic Assistive device utilized: None Level of assistance: Complete Independence Comments: Slight L antalgic gait noted with initial steps after standing   TODAY'S TREATMENT    DATE: 10/22/21 Evaluation  PATIENT EDUCATION:  Education details: Access code: [email or web address] Person educated: Patient Education method: Explanation, Demonstration, and Handouts Education comprehension: verbalized understanding and returned demonstration  HOME EXERCISE PROGRAM: Access code:  [email or web address]  ASSESSMENT:  CLINICAL IMPRESSION: Patient is a pleasant 76 y.o. female who was seen today for physical therapy evaluation and treatment for chronic L knee pain.   Pt. Reports no pain at rest and marked increase in L knee pain with increase activity/ walking.  L knee pain with active flexion (131 deg.) as compared to R knee (138 deg.).  L LE muscle weakness and pain/ guarding noted.  Moderate L knee warmth and joint line tenderness with palpation.  Pt. Will benefit from skilled PT services to increase L knee ROM/ strength to improve pain-free mobility.    OBJECTIVE IMPAIRMENTS: Abnormal gait, decreased activity tolerance, decreased balance, decreased endurance, decreased mobility, difficulty walking, decreased ROM, decreased strength, hypomobility, improper body mechanics, postural dysfunction, and pain.   ACTIVITY LIMITATIONS: carrying, lifting, bending, standing, squatting, stairs, transfers, and locomotion level  PARTICIPATION LIMITATIONS: meal prep and community activity  PERSONAL FACTORS: Fitness and Past/current experiences are also affecting patient's functional outcome.   REHAB POTENTIAL: Good  CLINICAL DECISION MAKING: Stable/uncomplicated  EVALUATION COMPLEXITY: Low   GOALS: Goals reviewed with patient? Yes  SHORT TERM GOALS: Target date: 11/12/21 Pt. Independent with HEP to increase L knee active flexion to 138 deg. To improve pain-free mobility.   Baseline:  see above Goal status: INITIAL    LONG TERM GOALS: Target date: 12/03/21  Pt. Will increase FOTO to 70 to improve pain-free mobility.   Baseline:  initial 61 Goal status: INITIAL  2.  Pt. Will increase L LE muscle strength 1/2 muscle grade to improve walking/ daily activity.   Baseline:  see above Goal status: INITIAL  3.  Pt. Will report 2/10 L knee pain at worst with daily exercise/ walking to daughter's house.   Baseline: >5/10 L knee pain Goal status: INITIAL   PLAN:  PT FREQUENCY:  1-2x/week  PT DURATION: 6 weeks  PLANNED INTERVENTIONS: Therapeutic exercises, Therapeutic activity, Neuromuscular re-education, Balance training, Gait training, Patient/Family education, Self Care, Joint mobilization, Stair training, Electrical stimulation, Cryotherapy, Moist heat, Manual therapy, and Re-evaluation  PLAN FOR NEXT SESSION: Progress HEP/ reassess L knee joint line tenderness and warmth.  Pura Spice, PT, DPT # [phone] 10/29/2021, 6:45 PM

## 2021-10-29 NOTE — Therapy (Signed)
OUTPATIENT PHYSICAL THERAPY LOWER EXTREMITY TREATMENT   Patient Name: Karina Hoffman MRN: [id number] DOB:1945-11-18, 76 y.o., female Today's Date: 10/29/21   PT End of Session - 10/29/21 1923     Visit Number 2    Number of Visits 13    Date for PT Re-Evaluation 12/03/21    PT Start Time 48    PT Stop Time 1116    PT Time Calculation (min) 51 min             Past Medical History:  Diagnosis Date   Arthritis    Cancer ([place])    Melanoma   Diverticulosis    Fibrocystic breast disease    Hyperlipidemia    Hypertension    Stroke [facility])    Uterine fibroid    Past Surgical History:  Procedure Laterality Date   ANKLE ARTHROSCOPY W/ OPEN REPAIR     CARPAL TUNNEL RELEASE Right    COLONOSCOPY     COLONOSCOPY N/A 08/30/2020   Procedure: COLONOSCOPY;  Surgeon: Toledo, Benay Pike, MD;  Location: [facility] ENDOSCOPY;  Service: Gastroenterology;  Laterality: N/A;   COLONOSCOPY WITH PROPOFOL N/A 01/22/2017   Procedure: COLONOSCOPY WITH PROPOFOL;  Surgeon: Manya Silvas, MD;  Location: [facility] ENDOSCOPY;  Service: Endoscopy;  Laterality: N/A;   triger finger release Right    TUBAL LIGATION     There are no problems to display for this patient.   PCP: Idelle Crouch, MD   REFERRING PROVIDER: Fausto Skillern, PA-C  REFERRING DIAG: Iliotibial band tendinitis of left side  Primary osteoarthritis of left knee (lateral compartment)   THERAPY DIAG:  Chronic pain of left knee  Joint stiffness of knee, left  Muscle weakness (generalized)  Rationale for Evaluation and Treatment Rehabilitation  ONSET DATE: 04/07/21  SUBJECTIVE:   SUBJECTIVE STATEMENT:     EVALUATION Pt. States she was diagnosed with RA 18 months ago.  Pt. Received a cortisone injection in L knee 3 months ago with minimal benefit.  Pt. Reports several episodes of L knee giving away and walks around 2 miles/ day and complete work-out video everyday.    PERTINENT HISTORY: Chief Complaint  Patient presents  with  Knee Pain  LEFT KNEE PAIN   History of Present Illness:   [name] is a 76 y.o. female that presents to clinic today for evaluation and management of left knee pain. Patient does have a history of rheumatoid arthritis with positive rheumatoid factor. Currently taking methotrexate. Received injection to the left infrapatellar bursa on 09/27/2021.  Her pain began around 6 months ago. The pain is located along the anterolateral aspect of the knee. She describes her pain as sharp and intermittent. Reports start up pain and stiffness. She reports associated feeling of giving way but no falls, some swelling in the knee. She denies associated locking. She reports minimal relief from the bursa injections.   Past Medical, Surgical, Family, Social History, Allergies, Medications:  Past Medical History:  Past Medical History:  Diagnosis Date  Carpal tunnel syndrome  Diverticulosis  Fibrocystic breast disease  Hyperlipidemia  Hypertension  Menopausal syndrome  Other nonspecific abnormal finding  S/P trigger finger release  Uterine fibroid   Past Surgical History:  Past Surgical History:  Procedure Laterality Date  COLONOSCOPY 01/22/2017  Adenomatous Polyps, FH Colon Polyps (Sister): CBF 01/2020  COLONOSCOPY 08/30/2020  Diverticulosis/PHx CP/No repeat due to age/TKT  COLONOSCOPY 12/25/2011, 02/23/2002  FH Colon Polyps (Sister)  TUBAL LIGATION   Current Medications:  Current Outpatient Medications  Medication Sig Dispense Refill  aspirin 81 MG EC tablet Take 81 mg by mouth once daily.  calcium carbonate 500 mg calcium (1,250 mg) tablet Take 500 mg of elemental by mouth once daily.  ezetimibe (ZETIA) 10 mg tablet Take 1 tablet by mouth once daily 90 tablet 0  folic acid (FOLVITE) 1 MG tablet TAKE 1 TABLET BY MOUTH EVERY DAY 90 tablet 1  meloxicam (MOBIC) 7.5 MG tablet Take 7.5 mg by mouth once daily as needed for Pain  methotrexate (RHEUMATREX) 2.5 MG tablet TAKE 6 TABLETS (15  MG TOTAL) BY MOUTH EVERY 7 (SEVEN) DAYS WITH A MEAL 72 tablet 1  propranoloL (INDERAL LA) 60 MG LA capsule Take 1 capsule by mouth once daily 90 capsule 0  traZODone (DESYREL) 50 MG tablet TAKE 1 TABLET BY MOUTH EVERYDAY AT BEDTIME 90 tablet 1  Patient ambulates unassisted. Patient able to actively flex the left hip. No pain with passive internal and external rotation of the hip.   left Knee Edema: Mild Effusion: None Patella push test: no crepitus and mild pain  Tenderness: Medial joint line and Lateral joint line , over Gerdy's tubercle Stability: lateral pseudolaxity Anterior drawer: no laxity  Posterior drawer: no laxity  McMurray: weakly positive laterally ROM: 0/0/138  The patient is able to plantarflex and dorsiflex the left ankle.   Patient is able to flex and extend the left hallux. Sensation is intact over the saphenous, lateral sural cutaneous, superficial fibular, and deep fibular nerve distributions. Dorsalis pedis, posterior tibialis 2+.  Tests Performed/Reviewed:   X-ray knee left 3 views  Result Date: 10/16/2021 Anteroposterior, lateral, and sunrise views of the left knee were obtained. Images reveal mild loss of lateral compartment joint space with mild osteophyte formation noted. Medial compartment appears relatively well-preserved. Mild loss of patellofemoral joint space noted.   PA flex view of the left knee shows moderate loss of lateral compartment joint space with mild osteophyte formation.   PAIN:  Are you having pain? Yes: NPRS scale: 0/10 Pain location: L knee Pain description: aching Aggravating factors: increase activity/ walking Relieving factors: rest  PRECAUTIONS: None  WEIGHT BEARING RESTRICTIONS: No  FALLS:  Has patient fallen in last 6 months? No  LIVING ENVIRONMENT: Lives with: lives alone Lives in: House/apartment Has following equipment at home: None  OCCUPATION: Retired.  Lives close to sister and daughter.    PLOF:  Independent  PATIENT GOALS: Increase L knee ROM/ strength to improve pain-free mobility/walking.     OBJECTIVE:   DIAGNOSTIC FINDINGS: See above MD note  PATIENT SURVEYS:  FOTO initial 61/ goal 64  COGNITION: Overall cognitive status: Within functional limits for tasks assessed     SENSATION: WFL  EDEMA:  Circumferential: L/R knee joint line (39/38 cm.), mid-calf (35.5/36 dm.), distal quad (41/41 cm).  MUSCLE LENGTH: Hamstrings: Right 67 deg; Left 67 deg  POSTURE: rounded shoulders (slight)  PALPATION: Moderate L knee warmth as compared to R knee.  L lateral knee tenderness.  (+) L ITB tenderness noted with good ROM as compared to R.    LOWER EXTREMITY ROM:  B LE AROM WNL except L knee AROM (131 deg. Guarded/pain).  R knee flexion 138 deg.  B knee extension 0 deg.   LOWER EXTREMITY MMT:  MMT Right eval Left eval  Hip flexion 4+ 4  Hip extension 4+ 4+  Hip abduction    Hip adduction 4 4  Hip internal rotation 4+ 4+  Hip external rotation 4+ 4+  Knee flexion 5  4+  Knee extension 5 4 pain  Ankle dorsiflexion 5 5  Ankle plantarflexion    Ankle inversion    Ankle eversion     (Blank rows = not tested)  LOWER EXTREMITY SPECIAL TESTS:  Knee special tests: Anterior drawer test: negative, Posterior drawer test: negative, McMurray's test: negative, and Steinmanns test: negative  (-) Ober test (bilateral).  (-) Fair test.  FUNCTIONAL TESTS:  5 times sit to stand: TBD  GAIT: Distance walked: in clinic Assistive device utilized: None Level of assistance: Complete Independence Comments: Slight L antalgic gait noted with initial steps after standing   TODAY'S TREATMENT    DATE: 10/29/21  Subjective: Pt. Entered PT with c/o 4/10 L knee pain.  Pt. Reports compliance with icing and HEP.    There.ex.:  Nustep L4 10 min. B UE/LE (warm-up)- reviewed HEP  See updated HEP/ issued GTB for home use  Manual tx.:  Supine L LE generalized stretches with focus on  distal hamstring/ quad muscle.  Supine L patellar mobs (all plane)- 5x 10 sec. Each.    Supine proximal tibia-fibula AP mobs. Grade II-III 3x 20 sec.     PATIENT EDUCATION:  Education details: Access code: [email or web address] Person educated: Patient Education method: Explanation, Demonstration, and Handouts Education comprehension: verbalized understanding and returned demonstration  HOME EXERCISE PROGRAM:  Access Code: [email or web address] URL: [email or web address] Date: 10/29/2021 Prepared by: Dorcas Carrow  Exercises - Supine Hamstring Stretch  - 1 x daily - 7 x weekly - 1 sets - 3 reps - 20 seconds hold - Supine ITB Stretch  - 1 x daily - 7 x weekly - 1 sets - 3 reps - 20 seconds hold - Supine Piriformis Stretch with Leg Straight  - 1 x daily - 7 x weekly - 1 sets - 3 reps - 20 seconds hold - Sidelying Hip Abduction  - 1 x daily - 5 x weekly - 2 sets - 10 reps - Hooklying Clamshell with Resistance  - 1 x daily - 5 x weekly - 1 sets - 20 reps - Supine March with Resistance Band  - 1 x daily - 5 x weekly - 1 sets - 20 reps  ASSESSMENT:  CLINICAL IMPRESSION: Patient continues to present with moderate L knee warmth and joint line tenderness with palpation.  Pt. Demonstrates good understanding of HEP/ stretching and able to complete resisted there.ex. with no increase pain.  Pt. Will continue to ice knee to manage inflammation/ pain to promote increase ROM/ strength.  Pt. Will benefit from skilled PT services to increase L knee ROM/ strength to improve pain-free mobility.    OBJECTIVE IMPAIRMENTS: Abnormal gait, decreased activity tolerance, decreased balance, decreased endurance, decreased mobility, difficulty walking, decreased ROM, decreased strength, hypomobility, improper body mechanics, postural dysfunction, and pain.   ACTIVITY LIMITATIONS: carrying, lifting, bending, standing, squatting, stairs, transfers, and locomotion level  PARTICIPATION LIMITATIONS: meal prep and community  activity  PERSONAL FACTORS: Fitness and Past/current experiences are also affecting patient's functional outcome.   REHAB POTENTIAL: Good  CLINICAL DECISION MAKING: Stable/uncomplicated  EVALUATION COMPLEXITY: Low   GOALS: Goals reviewed with patient? Yes  SHORT TERM GOALS: Target date: 11/12/21 Pt. Independent with HEP to increase L knee active flexion to 138 deg. To improve pain-free mobility.   Baseline:  see above Goal status: INITIAL    LONG TERM GOALS: Target date: 12/03/21  Pt. Will increase FOTO to 70 to improve pain-free mobility.   Baseline:  initial 61 Goal status: INITIAL  2.  Pt. Will  increase L LE muscle strength 1/2 muscle grade to improve walking/ daily activity.   Baseline:  see above Goal status: INITIAL  3.  Pt. Will report 2/10 L knee pain at worst with daily exercise/ walking to daughter's house.   Baseline: >5/10 L knee pain Goal status: INITIAL   PLAN:  PT FREQUENCY: 1-2x/week  PT DURATION: 6 weeks  PLANNED INTERVENTIONS: Therapeutic exercises, Therapeutic activity, Neuromuscular re-education, Balance training, Gait training, Patient/Family education, Self Care, Joint mobilization, Stair training, Electrical stimulation, Cryotherapy, Moist heat, Manual therapy, and Re-evaluation  PLAN FOR NEXT SESSION: Progress HEP/ reassess L knee joint line tenderness and warmth.  Pura Spice, PT, DPT # [phone] 10/29/2021, 7:24 PM

## 2021-11-05 ENCOUNTER — Ambulatory Visit: Payer: Medicare HMO | Admitting: Physical Therapy

## 2021-11-05 DIAGNOSIS — M25662 Stiffness of left knee, not elsewhere classified: Secondary | ICD-10-CM

## 2021-11-05 DIAGNOSIS — M6281 Muscle weakness (generalized): Secondary | ICD-10-CM

## 2021-11-05 DIAGNOSIS — M25562 Pain in left knee: Secondary | ICD-10-CM | POA: Diagnosis not present

## 2021-11-05 DIAGNOSIS — G8929 Other chronic pain: Secondary | ICD-10-CM

## 2021-11-05 NOTE — Therapy (Signed)
OUTPATIENT PHYSICAL THERAPY LOWER EXTREMITY TREATMENT   Patient Name: Karina Hoffman MRN: [id number] DOB:1945/12/07, 76 y.o., female Today's Date: 11/05/21   PT End of Session - 11/05/21 1114     Visit Number 3    Number of Visits 13    Date for PT Re-Evaluation 12/03/21    PT Start Time 1114    PT Stop Time 1204    PT Time Calculation (min) 50 min             Past Medical History:  Diagnosis Date   Arthritis    Cancer ([place])    Melanoma   Diverticulosis    Fibrocystic breast disease    Hyperlipidemia    Hypertension    Stroke [facility])    Uterine fibroid    Past Surgical History:  Procedure Laterality Date   ANKLE ARTHROSCOPY W/ OPEN REPAIR     CARPAL TUNNEL RELEASE Right    COLONOSCOPY     COLONOSCOPY N/A 08/30/2020   Procedure: COLONOSCOPY;  Surgeon: Toledo, Benay Pike, MD;  Location: [facility] ENDOSCOPY;  Service: Gastroenterology;  Laterality: N/A;   COLONOSCOPY WITH PROPOFOL N/A 01/22/2017   Procedure: COLONOSCOPY WITH PROPOFOL;  Surgeon: Manya Silvas, MD;  Location: [facility] ENDOSCOPY;  Service: Endoscopy;  Laterality: N/A;   triger finger release Right    TUBAL LIGATION     There are no problems to display for this patient.   PCP: Idelle Crouch, MD   REFERRING PROVIDER: Fausto Skillern, PA-C  REFERRING DIAG: Iliotibial band tendinitis of left side  Primary osteoarthritis of left knee (lateral compartment)   THERAPY DIAG:  Chronic pain of left knee  Joint stiffness of knee, left  Muscle weakness (generalized)  Rationale for Evaluation and Treatment Rehabilitation  ONSET DATE: 04/07/21  SUBJECTIVE:   SUBJECTIVE STATEMENT:     EVALUATION Pt. States she was diagnosed with RA 18 months ago.  Pt. Received a cortisone injection in L knee 3 months ago with minimal benefit.  Pt. Reports several episodes of L knee giving away and walks around 2 miles/ day and complete work-out video everyday.    PERTINENT HISTORY: Chief Complaint  Patient presents  with  Knee Pain  LEFT KNEE PAIN   History of Present Illness:   Karina Hoffman is a 76 y.o. female that presents to clinic today for evaluation and management of left knee pain. Patient does have a history of rheumatoid arthritis with positive rheumatoid factor. Currently taking methotrexate. Received injection to the left infrapatellar bursa on 09/27/2021.  Her pain began around 6 months ago. The pain is located along the anterolateral aspect of the knee. She describes her pain as sharp and intermittent. Reports start up pain and stiffness. She reports associated feeling of giving way but no falls, some swelling in the knee. She denies associated locking. She reports minimal relief from the bursa injections.   Past Medical, Surgical, Family, Social History, Allergies, Medications:  Past Medical History:  Past Medical History:  Diagnosis Date  Carpal tunnel syndrome  Diverticulosis  Fibrocystic breast disease  Hyperlipidemia  Hypertension  Menopausal syndrome  Other nonspecific abnormal finding  S/P trigger finger release  Uterine fibroid   Past Surgical History:  Past Surgical History:  Procedure Laterality Date  COLONOSCOPY 01/22/2017  Adenomatous Polyps, FH Colon Polyps (Sister): CBF 01/2020  COLONOSCOPY 08/30/2020  Diverticulosis/PHx CP/No repeat due to age/TKT  COLONOSCOPY 12/25/2011, 02/23/2002  FH Colon Polyps (Sister)  TUBAL LIGATION   Current Medications:  Current Outpatient Medications  Medication Sig Dispense Refill  aspirin 81 MG EC tablet Take 81 mg by mouth once daily.  calcium carbonate 500 mg calcium (1,250 mg) tablet Take 500 mg of elemental by mouth once daily.  ezetimibe (ZETIA) 10 mg tablet Take 1 tablet by mouth once daily 90 tablet 0  folic acid (FOLVITE) 1 MG tablet TAKE 1 TABLET BY MOUTH EVERY DAY 90 tablet 1  meloxicam (MOBIC) 7.5 MG tablet Take 7.5 mg by mouth once daily as needed for Pain  methotrexate (RHEUMATREX) 2.5 MG tablet TAKE 6 TABLETS (15  MG TOTAL) BY MOUTH EVERY 7 (SEVEN) DAYS WITH A MEAL 72 tablet 1  propranoloL (INDERAL LA) 60 MG LA capsule Take 1 capsule by mouth once daily 90 capsule 0  traZODone (DESYREL) 50 MG tablet TAKE 1 TABLET BY MOUTH EVERYDAY AT BEDTIME 90 tablet 1  Patient ambulates unassisted. Patient able to actively flex the left hip. No pain with passive internal and external rotation of the hip.   left Knee Edema: Mild Effusion: None Patella push test: no crepitus and mild pain  Tenderness: Medial joint line and Lateral joint line , over Gerdy's tubercle Stability: lateral pseudolaxity Anterior drawer: no laxity  Posterior drawer: no laxity  McMurray: weakly positive laterally ROM: 0/0/138  The patient is able to plantarflex and dorsiflex the left ankle.   Patient is able to flex and extend the left hallux. Sensation is intact over the saphenous, lateral sural cutaneous, superficial fibular, and deep fibular nerve distributions. Dorsalis pedis, posterior tibialis 2+.  Tests Performed/Reviewed:   X-ray knee left 3 views  Result Date: 10/16/2021 Anteroposterior, lateral, and sunrise views of the left knee were obtained. Images reveal mild loss of lateral compartment joint space with mild osteophyte formation noted. Medial compartment appears relatively well-preserved. Mild loss of patellofemoral joint space noted.   PA flex view of the left knee shows moderate loss of lateral compartment joint space with mild osteophyte formation.   PAIN:  Are you having pain? Yes: NPRS scale: 0/10 Pain location: L knee Pain description: aching Aggravating factors: increase activity/ walking Relieving factors: rest  PRECAUTIONS: None  WEIGHT BEARING RESTRICTIONS: No  FALLS:  Has patient fallen in last 6 months? No  LIVING ENVIRONMENT: Lives with: lives alone Lives in: House/apartment Has following equipment at home: None  OCCUPATION: Retired.  Lives close to sister and daughter.    PLOF:  Independent  PATIENT GOALS: Increase L knee ROM/ strength to improve pain-free mobility/walking.     OBJECTIVE:   DIAGNOSTIC FINDINGS: See above MD note  PATIENT SURVEYS:  FOTO initial 61/ goal 64  COGNITION: Overall cognitive status: Within functional limits for tasks assessed     SENSATION: WFL  EDEMA:  Circumferential: L/R knee joint line (39/38 cm.), mid-calf (35.5/36 dm.), distal quad (41/41 cm).  MUSCLE LENGTH: Hamstrings: Right 67 deg; Left 67 deg  POSTURE: rounded shoulders (slight)  PALPATION: Moderate L knee warmth as compared to R knee.  L lateral knee tenderness.  (+) L ITB tenderness noted with good ROM as compared to R.    LOWER EXTREMITY ROM:  B LE AROM WNL except L knee AROM (131 deg. Guarded/pain).  R knee flexion 138 deg.  B knee extension 0 deg.   LOWER EXTREMITY MMT:  MMT Right eval Left eval  Hip flexion 4+ 4  Hip extension 4+ 4+  Hip abduction    Hip adduction 4 4  Hip internal rotation 4+ 4+  Hip external rotation 4+ 4+  Knee flexion 5  4+  Knee extension 5 4 pain  Ankle dorsiflexion 5 5  Ankle plantarflexion    Ankle inversion    Ankle eversion     (Blank rows = not tested)  LOWER EXTREMITY SPECIAL TESTS:  Knee special tests: Anterior drawer test: negative, Posterior drawer test: negative, McMurray's test: negative, and Steinmanns test: negative  (-) Ober test (bilateral).  (-) Fair test.  FUNCTIONAL TESTS:  5 times sit to stand: TBD  GAIT: Distance walked: in clinic Assistive device utilized: None Level of assistance: Complete Independence Comments: Slight L antalgic gait noted with initial steps after standing   TODAY'S TREATMENT    DATE: 11/05/21  Subjective: Pt. Reports compliance with daily icing and HEP.  Pt. c/o 4/10 L knee pain and states she has persistent warmth in L knee.  No new complaints.    There.ex.:  Tandem stance/ gait (forward/ backwards)- in //-bars with light to no UE assist.      Nustep L5 10 min.  B UE/LE (warm-up)- reviewed HEP  See updated HEP/ progressed theraband to BTB  Manual tx.:  Supine L LE generalized stretches with focus on distal hamstring/ quad muscle/ ITB.  Supine L patellar mobs (all plane)- 5x 10 sec. Each.    Supine proximal tibia-fibula AP mobs. Grade II-III 3x 20 sec.   Supine L LAD 3x 20 sec. (Decrease knee symptoms/ pain).      PATIENT EDUCATION:  Education details: Access code: [email or web address] Person educated: Patient Education method: Explanation, Demonstration, and Handouts Education comprehension: verbalized understanding and returned demonstration  HOME EXERCISE PROGRAM:  Access Code: [email or web address] URL: [email or web address] Date: 10/29/2021 Prepared by: Dorcas Carrow  Exercises - Supine Hamstring Stretch  - 1 x daily - 7 x weekly - 1 sets - 3 reps - 20 seconds hold - Supine ITB Stretch  - 1 x daily - 7 x weekly - 1 sets - 3 reps - 20 seconds hold - Supine Piriformis Stretch with Leg Straight  - 1 x daily - 7 x weekly - 1 sets - 3 reps - 20 seconds hold - Sidelying Hip Abduction  - 1 x daily - 5 x weekly - 2 sets - 10 reps - Hooklying Clamshell with Resistance  - 1 x daily - 5 x weekly - 1 sets - 20 reps - Supine March with Resistance Band  - 1 x daily - 5 x weekly - 1 sets - 20 reps  ASSESSMENT:  CLINICAL IMPRESSION: Patient continues to present with moderate L knee warmth and joint line tenderness with palpation.  Pt. Demonstrates good understanding of HEP/ stretching and able to complete resisted there.ex. with no increase pain.  PT progressed theraband to BTB for HEP.  Pt. Will continue to ice knee to manage inflammation/ pain to promote increase ROM/ strength.  Pt. Will benefit from skilled PT services to increase L knee ROM/ strength to improve pain-free mobility.    OBJECTIVE IMPAIRMENTS: Abnormal gait, decreased activity tolerance, decreased balance, decreased endurance, decreased mobility, difficulty walking, decreased ROM, decreased  strength, hypomobility, improper body mechanics, postural dysfunction, and pain.   ACTIVITY LIMITATIONS: carrying, lifting, bending, standing, squatting, stairs, transfers, and locomotion level  PARTICIPATION LIMITATIONS: meal prep and community activity  PERSONAL FACTORS: Fitness and Past/current experiences are also affecting patient's functional outcome.   REHAB POTENTIAL: Good  CLINICAL DECISION MAKING: Stable/uncomplicated  EVALUATION COMPLEXITY: Low   GOALS: Goals reviewed with patient? Yes  SHORT TERM GOALS: Target date: 11/12/21 Pt. Independent with HEP to increase L knee active flexion  to 138 deg. To improve pain-free mobility.   Baseline:  see above Goal status: INITIAL    LONG TERM GOALS: Target date: 12/03/21  Pt. Will increase FOTO to 70 to improve pain-free mobility.   Baseline:  initial 61 Goal status: INITIAL  2.  Pt. Will increase L LE muscle strength 1/2 muscle grade to improve walking/ daily activity.   Baseline:  see above Goal status: INITIAL  3.  Pt. Will report 2/10 L knee pain at worst with daily exercise/ walking to daughter's house.   Baseline: >5/10 L knee pain Goal status: INITIAL   PLAN:  PT FREQUENCY: 1-2x/week  PT DURATION: 6 weeks  PLANNED INTERVENTIONS: Therapeutic exercises, Therapeutic activity, Neuromuscular re-education, Balance training, Gait training, Patient/Family education, Self Care, Joint mobilization, Stair training, Electrical stimulation, Cryotherapy, Moist heat, Manual therapy, and Re-evaluation  PLAN FOR NEXT SESSION: Progress HEP/ reassess L knee joint line tenderness and warmth.  Pura Spice, PT, DPT # [phone] 11/05/2021, 6:43 PM

## 2021-11-12 ENCOUNTER — Ambulatory Visit: Payer: Medicare HMO | Attending: Orthopedic Surgery | Admitting: Physical Therapy

## 2021-11-12 DIAGNOSIS — G8929 Other chronic pain: Secondary | ICD-10-CM | POA: Insufficient documentation

## 2021-11-12 DIAGNOSIS — M25562 Pain in left knee: Secondary | ICD-10-CM | POA: Insufficient documentation

## 2021-11-12 DIAGNOSIS — M25662 Stiffness of left knee, not elsewhere classified: Secondary | ICD-10-CM | POA: Diagnosis present

## 2021-11-12 DIAGNOSIS — M6281 Muscle weakness (generalized): Secondary | ICD-10-CM | POA: Diagnosis present

## 2021-11-12 NOTE — Therapy (Signed)
OUTPATIENT PHYSICAL THERAPY LOWER EXTREMITY TREATMENT   Patient Name: Karina Hoffman MRN: [id number] DOB:Feb 17, 1945, 76 y.o., female Today's Date: 11/12/21   PT End of Session - 11/12/21 0857     Visit Number 4    Number of Visits 13    Date for PT Re-Evaluation 12/03/21    PT Start Time 1749    PT Stop Time 0946    PT Time Calculation (min) 49 min             Past Medical History:  Diagnosis Date   Arthritis    Cancer ([place])    Melanoma   Diverticulosis    Fibrocystic breast disease    Hyperlipidemia    Hypertension    Stroke [facility])    Uterine fibroid    Past Surgical History:  Procedure Laterality Date   ANKLE ARTHROSCOPY W/ OPEN REPAIR     CARPAL TUNNEL RELEASE Right    COLONOSCOPY     COLONOSCOPY N/A 08/30/2020   Procedure: COLONOSCOPY;  Surgeon: Toledo, Benay Pike, MD;  Location: [facility] ENDOSCOPY;  Service: Gastroenterology;  Laterality: N/A;   COLONOSCOPY WITH PROPOFOL N/A 01/22/2017   Procedure: COLONOSCOPY WITH PROPOFOL;  Surgeon: Karina Silvas, MD;  Location: [facility] ENDOSCOPY;  Service: Endoscopy;  Laterality: N/A;   triger finger release Right    TUBAL LIGATION     There are no problems to display for this patient.   PCP: Karina Crouch, MD   REFERRING PROVIDER: Fausto Skillern, PA-C  REFERRING DIAG: Iliotibial band tendinitis of left side  Primary osteoarthritis of left knee (lateral compartment)   THERAPY DIAG:  Chronic pain of left knee  Joint stiffness of knee, left  Muscle weakness (generalized)  Rationale for Evaluation and Treatment Rehabilitation  ONSET DATE: 04/07/21  SUBJECTIVE:   SUBJECTIVE STATEMENT:     EVALUATION Pt. States she was diagnosed with RA 18 months ago.  Pt. Received a cortisone injection in L knee 3 months ago with minimal benefit.  Pt. Reports several episodes of L knee giving away and walks around 2 miles/ day and complete work-out video everyday.    PERTINENT HISTORY: Chief Complaint  Patient presents  with  Knee Pain  LEFT KNEE PAIN   History of Present Illness:   [name] is a 76 y.o. female that presents to clinic today for evaluation and management of left knee pain. Patient does have a history of rheumatoid arthritis with positive rheumatoid factor. Currently taking methotrexate. Received injection to the left infrapatellar bursa on 09/27/2021.  Her pain began around 6 months ago. The pain is located along the anterolateral aspect of the knee. She describes her pain as sharp and intermittent. Reports start up pain and stiffness. She reports associated feeling of giving way but no falls, some swelling in the knee. She denies associated locking. She reports minimal relief from the bursa injections.   Past Medical, Surgical, Family, Social History, Allergies, Medications:  Past Medical History:  Past Medical History:  Diagnosis Date  Carpal tunnel syndrome  Diverticulosis  Fibrocystic breast disease  Hyperlipidemia  Hypertension  Menopausal syndrome  Other nonspecific abnormal finding  S/P trigger finger release  Uterine fibroid   Past Surgical History:  Past Surgical History:  Procedure Laterality Date  COLONOSCOPY 01/22/2017  Adenomatous Polyps, FH Colon Polyps (Sister): CBF 01/2020  COLONOSCOPY 08/30/2020  Diverticulosis/PHx CP/No repeat due to age/TKT  COLONOSCOPY 12/25/2011, 02/23/2002  FH Colon Polyps (Sister)  TUBAL LIGATION   Current Medications:  Current Outpatient Medications  Medication Sig Dispense Refill  aspirin 81 MG EC tablet Take 81 mg by mouth once daily.  calcium carbonate 500 mg calcium (1,250 mg) tablet Take 500 mg of elemental by mouth once daily.  ezetimibe (ZETIA) 10 mg tablet Take 1 tablet by mouth once daily 90 tablet 0  folic acid (FOLVITE) 1 MG tablet TAKE 1 TABLET BY MOUTH EVERY DAY 90 tablet 1  meloxicam (MOBIC) 7.5 MG tablet Take 7.5 mg by mouth once daily as needed for Pain  methotrexate (RHEUMATREX) 2.5 MG tablet TAKE 6 TABLETS (15  MG TOTAL) BY MOUTH EVERY 7 (SEVEN) DAYS WITH A MEAL 72 tablet 1  propranoloL (INDERAL LA) 60 MG LA capsule Take 1 capsule by mouth once daily 90 capsule 0  traZODone (DESYREL) 50 MG tablet TAKE 1 TABLET BY MOUTH EVERYDAY AT BEDTIME 90 tablet 1  Patient ambulates unassisted. Patient able to actively flex the left hip. No pain with passive internal and external rotation of the hip.   left Knee Edema: Mild Effusion: None Patella push test: no crepitus and mild pain  Tenderness: Medial joint line and Lateral joint line , over Gerdy's tubercle Stability: lateral pseudolaxity Anterior drawer: no laxity  Posterior drawer: no laxity  McMurray: weakly positive laterally ROM: 0/0/138  The patient is able to plantarflex and dorsiflex the left ankle.   Patient is able to flex and extend the left hallux. Sensation is intact over the saphenous, lateral sural cutaneous, superficial fibular, and deep fibular nerve distributions. Dorsalis pedis, posterior tibialis 2+.  Tests Performed/Reviewed:   X-ray knee left 3 views  Result Date: 10/16/2021 Anteroposterior, lateral, and sunrise views of the left knee were obtained. Images reveal mild loss of lateral compartment joint space with mild osteophyte formation noted. Medial compartment appears relatively well-preserved. Mild loss of patellofemoral joint space noted.   PA flex view of the left knee shows moderate loss of lateral compartment joint space with mild osteophyte formation.   PAIN:  Are you having pain? Yes: NPRS scale: 0/10 Pain location: L knee Pain description: aching Aggravating factors: increase activity/ walking Relieving factors: rest  PRECAUTIONS: None  WEIGHT BEARING RESTRICTIONS: No  FALLS:  Has patient fallen in last 6 months? No  LIVING ENVIRONMENT: Lives with: lives alone Lives in: House/apartment Has following equipment at home: None  OCCUPATION: Retired.  Lives close to sister and daughter.    PLOF:  Independent  PATIENT GOALS: Increase L knee ROM/ strength to improve pain-free mobility/walking.     OBJECTIVE:   DIAGNOSTIC FINDINGS: See above MD note  PATIENT SURVEYS:  FOTO initial 61/ goal 64  COGNITION: Overall cognitive status: Within functional limits for tasks assessed     SENSATION: WFL  EDEMA:  Circumferential: L/R knee joint line (39/38 cm.), mid-calf (35.5/36 dm.), distal quad (41/41 cm).  MUSCLE LENGTH: Hamstrings: Right 67 deg; Left 67 deg  POSTURE: rounded shoulders (slight)  PALPATION: Moderate L knee warmth as compared to R knee.  L lateral knee tenderness.  (+) L ITB tenderness noted with good ROM as compared to R.    LOWER EXTREMITY ROM:  B LE AROM WNL except L knee AROM (131 deg. Guarded/pain).  R knee flexion 138 deg.  B knee extension 0 deg.   LOWER EXTREMITY MMT:  MMT Right eval Left eval  Hip flexion 4+ 4  Hip extension 4+ 4+  Hip abduction    Hip adduction 4 4  Hip internal rotation 4+ 4+  Hip external rotation 4+ 4+  Knee flexion 5  4+  Knee extension 5 4 pain  Ankle dorsiflexion 5 5  Ankle plantarflexion    Ankle inversion    Ankle eversion     (Blank rows = not tested)  LOWER EXTREMITY SPECIAL TESTS:  Knee special tests: Anterior drawer test: negative, Posterior drawer test: negative, McMurray's test: negative, and Steinmanns test: negative  (-) Ober test (bilateral).  (-) Fair test.  FUNCTIONAL TESTS:  5 times sit to stand: TBD  GAIT: Distance walked: in clinic Assistive device utilized: None Level of assistance: Complete Independence Comments: Slight L antalgic gait noted with initial steps after standing   TODAY'S TREATMENT    DATE: 11/12/21  Subjective: Pt. Reports no L knee pain at this time.  Pt. Presents with continued warmth in L knee as compared to R.    There.ex.:  Nustep L5 10 min. B UE/LE (warm-up)- reviewed HEP  Supine B hip and knee flexion/ bridging with white ball 20x.  No pain.   Supine SLR 10x/ R  sidelying L hip abduction 10x.  Good control/ technique.  Reviewed technique with ITB stretches (modified to improve stretch to hip/ITB).    Manual tx.:  Supine L LE generalized stretches with focus on distal hamstring/ quad muscle/ ITB.  Supine L patellar mobs (all plane)- 5x 10 sec. Each.    Supine proximal tibia-fibula AP mobs. Grade II-III 3x 20 sec.   Supine L LAD 3x 20 sec. (Decrease knee symptoms/ pain).      PATIENT EDUCATION:  Education details: Access code: [email or web address] Person educated: Patient Education method: Explanation, Demonstration, and Handouts Education comprehension: verbalized understanding and returned demonstration  HOME EXERCISE PROGRAM:  Access Code: [email or web address] URL: [email or web address] Date: 10/29/2021 Prepared by: Dorcas Carrow  Exercises - Supine Hamstring Stretch  - 1 x daily - 7 x weekly - 1 sets - 3 reps - 20 seconds hold - Supine ITB Stretch  - 1 x daily - 7 x weekly - 1 sets - 3 reps - 20 seconds hold - Supine Piriformis Stretch with Leg Straight  - 1 x daily - 7 x weekly - 1 sets - 3 reps - 20 seconds hold - Sidelying Hip Abduction  - 1 x daily - 5 x weekly - 2 sets - 10 reps - Hooklying Clamshell with Resistance  - 1 x daily - 5 x weekly - 1 sets - 20 reps - Supine March with Resistance Band  - 1 x daily - 5 x weekly - 1 sets - 20 reps  ASSESSMENT:  CLINICAL IMPRESSION: Patient continues to present with moderate L knee warmth and joint line tenderness with palpation.  Pt. Demonstrates good understanding of HEP/ stretching and able to complete resisted there.ex. with no increase pain.  No increase c/o L knee pain with good quad muscle activation with supine ex.  Pt. Will continue to ice knee to manage inflammation/ pain to promote increase ROM/ strength.  Pt. Will benefit from skilled PT services to increase L knee ROM/ strength to improve pain-free mobility.    OBJECTIVE IMPAIRMENTS: Abnormal gait, decreased activity tolerance, decreased  balance, decreased endurance, decreased mobility, difficulty walking, decreased ROM, decreased strength, hypomobility, improper body mechanics, postural dysfunction, and pain.   ACTIVITY LIMITATIONS: carrying, lifting, bending, standing, squatting, stairs, transfers, and locomotion level  PARTICIPATION LIMITATIONS: meal prep and community activity  PERSONAL FACTORS: Fitness and Past/current experiences are also affecting patient's functional outcome.   REHAB POTENTIAL: Good  CLINICAL DECISION MAKING: Stable/uncomplicated  EVALUATION COMPLEXITY: Low   GOALS: Goals reviewed with  patient? Yes  SHORT TERM GOALS: Target date: 11/12/21 Pt. Independent with HEP to increase L knee active flexion to 138 deg. To improve pain-free mobility.   Baseline:  see above Goal status: INITIAL    LONG TERM GOALS: Target date: 12/03/21  Pt. Will increase FOTO to 70 to improve pain-free mobility.   Baseline:  initial 61 Goal status: INITIAL  2.  Pt. Will increase L LE muscle strength 1/2 muscle grade to improve walking/ daily activity.   Baseline:  see above Goal status: INITIAL  3.  Pt. Will report 2/10 L knee pain at worst with daily exercise/ walking to daughter's house.   Baseline: >5/10 L knee pain Goal status: INITIAL   PLAN:  PT FREQUENCY: 1-2x/week  PT DURATION: 6 weeks  PLANNED INTERVENTIONS: Therapeutic exercises, Therapeutic activity, Neuromuscular re-education, Balance training, Gait training, Patient/Family education, Self Care, Joint mobilization, Stair training, Electrical stimulation, Cryotherapy, Moist heat, Manual therapy, and Re-evaluation  PLAN FOR NEXT SESSION: Check goals/ schedule.     Pura Spice, PT, DPT # [phone] 11/12/2021, 6:03 PM

## 2021-11-19 ENCOUNTER — Ambulatory Visit: Payer: Medicare HMO | Admitting: Physical Therapy

## 2021-11-19 DIAGNOSIS — M6281 Muscle weakness (generalized): Secondary | ICD-10-CM

## 2021-11-19 DIAGNOSIS — G8929 Other chronic pain: Secondary | ICD-10-CM

## 2021-11-19 DIAGNOSIS — M25562 Pain in left knee: Secondary | ICD-10-CM | POA: Diagnosis not present

## 2021-11-19 DIAGNOSIS — M25662 Stiffness of left knee, not elsewhere classified: Secondary | ICD-10-CM

## 2021-11-19 NOTE — Therapy (Signed)
OUTPATIENT PHYSICAL THERAPY LOWER EXTREMITY TREATMENT   Patient Name: Karina Hoffman MRN: [id number] DOB:08/24/1945, 76 y.o., female Today's Date: 11/19/21   PT End of Session - 11/19/21 0855     Visit Number 5    Number of Visits 13    Date for PT Re-Evaluation 12/03/21    PT Start Time 4496    PT Stop Time 0944    PT Time Calculation (min) 49 min             Past Medical History:  Diagnosis Date   Arthritis    Cancer ([place])    Melanoma   Diverticulosis    Fibrocystic breast disease    Hyperlipidemia    Hypertension    Stroke [facility])    Uterine fibroid    Past Surgical History:  Procedure Laterality Date   ANKLE ARTHROSCOPY W/ OPEN REPAIR     CARPAL TUNNEL RELEASE Right    COLONOSCOPY     COLONOSCOPY N/A 08/30/2020   Procedure: COLONOSCOPY;  Surgeon: Toledo, Benay Pike, MD;  Location: [facility] ENDOSCOPY;  Service: Gastroenterology;  Laterality: N/A;   COLONOSCOPY WITH PROPOFOL N/A 01/22/2017   Procedure: COLONOSCOPY WITH PROPOFOL;  Surgeon: Manya Silvas, MD;  Location: [facility] ENDOSCOPY;  Service: Endoscopy;  Laterality: N/A;   triger finger release Right    TUBAL LIGATION     There are no problems to display for this patient.   PCP: Idelle Crouch, MD   REFERRING PROVIDER: Fausto Skillern, PA-C  REFERRING DIAG: Iliotibial band tendinitis of left side  Primary osteoarthritis of left knee (lateral compartment)   THERAPY DIAG:  Chronic pain of left knee  Joint stiffness of knee, left  Muscle weakness (generalized)  Rationale for Evaluation and Treatment Rehabilitation  ONSET DATE: 04/07/21  SUBJECTIVE:   SUBJECTIVE STATEMENT:     EVALUATION Pt. States she was diagnosed with RA 18 months ago.  Pt. Received a cortisone injection in L knee 3 months ago with minimal benefit.  Pt. Reports several episodes of L knee giving away and walks around 2 miles/ day and complete work-out video everyday.    PERTINENT HISTORY: Chief Complaint  Patient presents  with  Knee Pain  LEFT KNEE PAIN   History of Present Illness:   Karina Hoffman is a 76 y.o. female that presents to clinic today for evaluation and management of left knee pain. Patient does have a history of rheumatoid arthritis with positive rheumatoid factor. Currently taking methotrexate. Received injection to the left infrapatellar bursa on 09/27/2021.  Her pain began around 6 months ago. The pain is located along the anterolateral aspect of the knee. She describes her pain as sharp and intermittent. Reports start up pain and stiffness. She reports associated feeling of giving way but no falls, some swelling in the knee. She denies associated locking. She reports minimal relief from the bursa injections.   Past Medical, Surgical, Family, Social History, Allergies, Medications:  Past Medical History:  Past Medical History:  Diagnosis Date  Carpal tunnel syndrome  Diverticulosis  Fibrocystic breast disease  Hyperlipidemia  Hypertension  Menopausal syndrome  Other nonspecific abnormal finding  S/P trigger finger release  Uterine fibroid   Past Surgical History:  Past Surgical History:  Procedure Laterality Date  COLONOSCOPY 01/22/2017  Adenomatous Polyps, FH Colon Polyps (Sister): CBF 01/2020  COLONOSCOPY 08/30/2020  Diverticulosis/PHx CP/No repeat due to age/TKT  COLONOSCOPY 12/25/2011, 02/23/2002  FH Colon Polyps (Sister)  TUBAL LIGATION   Current Medications:  Current Outpatient Medications  Medication Sig Dispense Refill  aspirin 81 MG EC tablet Take 81 mg by mouth once daily.  calcium carbonate 500 mg calcium (1,250 mg) tablet Take 500 mg of elemental by mouth once daily.  ezetimibe (ZETIA) 10 mg tablet Take 1 tablet by mouth once daily 90 tablet 0  folic acid (FOLVITE) 1 MG tablet TAKE 1 TABLET BY MOUTH EVERY DAY 90 tablet 1  meloxicam (MOBIC) 7.5 MG tablet Take 7.5 mg by mouth once daily as needed for Pain  methotrexate (RHEUMATREX) 2.5 MG tablet TAKE 6 TABLETS (15  MG TOTAL) BY MOUTH EVERY 7 (SEVEN) DAYS WITH A MEAL 72 tablet 1  propranoloL (INDERAL LA) 60 MG LA capsule Take 1 capsule by mouth once daily 90 capsule 0  traZODone (DESYREL) 50 MG tablet TAKE 1 TABLET BY MOUTH EVERYDAY AT BEDTIME 90 tablet 1  Patient ambulates unassisted. Patient able to actively flex the left hip. No pain with passive internal and external rotation of the hip.   left Knee Edema: Mild Effusion: None Patella push test: no crepitus and mild pain  Tenderness: Medial joint line and Lateral joint line , over Gerdy's tubercle Stability: lateral pseudolaxity Anterior drawer: no laxity  Posterior drawer: no laxity  McMurray: weakly positive laterally ROM: 0/0/138  The patient is able to plantarflex and dorsiflex the left ankle.   Patient is able to flex and extend the left hallux. Sensation is intact over the saphenous, lateral sural cutaneous, superficial fibular, and deep fibular nerve distributions. Dorsalis pedis, posterior tibialis 2+.  Tests Performed/Reviewed:   X-ray knee left 3 views  Result Date: 10/16/2021 Anteroposterior, lateral, and sunrise views of the left knee were obtained. Images reveal mild loss of lateral compartment joint space with mild osteophyte formation noted. Medial compartment appears relatively well-preserved. Mild loss of patellofemoral joint space noted.   PA flex view of the left knee shows moderate loss of lateral compartment joint space with mild osteophyte formation.   PAIN:  Are you having pain? Yes: NPRS scale: 0/10 Pain location: L knee Pain description: aching Aggravating factors: increase activity/ walking Relieving factors: rest  PRECAUTIONS: None  WEIGHT BEARING RESTRICTIONS: No  FALLS:  Has patient fallen in last 6 months? No  LIVING ENVIRONMENT: Lives with: lives alone Lives in: House/apartment Has following equipment at home: None  OCCUPATION: Retired.  Lives close to sister and daughter.    PLOF:  Independent  PATIENT GOALS: Increase L knee ROM/ strength to improve pain-free mobility/walking.     OBJECTIVE:   DIAGNOSTIC FINDINGS: See above MD note  PATIENT SURVEYS:  FOTO initial 61/ goal 5  COGNITION: Overall cognitive status: Within functional limits for tasks assessed     SENSATION: WFL  EDEMA:  Circumferential: L/R knee joint line (39/38 cm.), mid-calf (35.5/36 dm.), distal quad (41/41 cm).  MUSCLE LENGTH: Hamstrings: Right 67 deg; Left 67 deg  POSTURE: rounded shoulders (slight)  PALPATION: Moderate L knee warmth as compared to R knee.  L lateral knee tenderness.  (+) L ITB tenderness noted with good ROM as compared to R.    LOWER EXTREMITY ROM:  B LE AROM WNL except L knee AROM (131 deg. Guarded/pain).  R knee flexion 138 deg.  B knee extension 0 deg.   LOWER EXTREMITY MMT:  MMT Right eval Left eval  Hip flexion 4+ 4  Hip extension 4+ 4+  Hip abduction    Hip adduction 4 4  Hip internal rotation 4+ 4+  Hip external rotation 4+ 4+  Knee flexion 5  4+  Knee extension 5 4 pain  Ankle dorsiflexion 5 5  Ankle plantarflexion    Ankle inversion    Ankle eversion     (Blank rows = not tested)  LOWER EXTREMITY SPECIAL TESTS:  Knee special tests: Anterior drawer test: negative, Posterior drawer test: negative, McMurray's test: negative, and Steinmanns test: negative  (-) Ober test (bilateral).  (-) Fair test.  FUNCTIONAL TESTS:  5 times sit to stand: TBD  GAIT: Distance walked: in clinic Assistive device utilized: None Level of assistance: Complete Independence Comments: Slight L antalgic gait noted with initial steps after standing   TODAY'S TREATMENT    DATE: 11/19/21  Subjective: Pt. Reports no L knee pain at this time.  Pt. Presents with continued warmth in L knee as compared to R.  Pt. Returns to Dr. Doy Hutching this Wednesday.  Pt. Reports increase pain in L knee with stair climbing.    There.ex.:  Nustep L5 10 min. B UE/LE (warm-up)-  reviewed HEP  Supine SLR/ hip abduction/ bridging 20x.  No pain.   Seated L hamstring 4+/5 MMT as compared R 5/5 MMT.  B knee extension 5/5 MMT.    Tandem stance/ gait/ SLS (good balance).  Walking in hallway with instruction to stay in center of hallway.  Good heel strike/ step pattern/ arm swing.    STS from mat table (low setting) with no UE assist 10x.    Manual tx.:  Supine L LE generalized stretches with focus on distal hamstring/ quad muscle/ ITB.  Supine L patellar mobs (all plane)- 5x 10 sec. Each.    Supine proximal tibia-fibula AP mobs. Grade II-III 3x 20 sec.   Supine L LAD 3x 20 sec. (Decrease knee symptoms/ pain).      PATIENT EDUCATION:  Education details: Access code: [email or web address] Person educated: Patient Education method: Explanation, Demonstration, and Handouts Education comprehension: verbalized understanding and returned demonstration  HOME EXERCISE PROGRAM:  Access Code: [email or web address] URL: [email or web address] Date: 10/29/2021 Prepared by: Dorcas Carrow  Exercises - Supine Hamstring Stretch  - 1 x daily - 7 x weekly - 1 sets - 3 reps - 20 seconds hold - Supine ITB Stretch  - 1 x daily - 7 x weekly - 1 sets - 3 reps - 20 seconds hold - Supine Piriformis Stretch with Leg Straight  - 1 x daily - 7 x weekly - 1 sets - 3 reps - 20 seconds hold - Sidelying Hip Abduction  - 1 x daily - 5 x weekly - 2 sets - 10 reps - Hooklying Clamshell with Resistance  - 1 x daily - 5 x weekly - 1 sets - 20 reps - Supine March with Resistance Band  - 1 x daily - 5 x weekly - 1 sets - 20 reps  ASSESSMENT:  CLINICAL IMPRESSION: Patient continues to present with moderate L knee warmth and joint line tenderness with palpation.  Pt. Demonstrates good understanding of HEP/ stretching and able to complete resisted there.ex. with no increase pain.  No increase c/o L knee pain with good quad muscle activation with supine ex.  Pt. Will continue to ice knee to manage inflammation/  pain to promote increase ROM/ strength.  Pt. Returns to PCP this week to discuss status.  Pt. Will benefit from skilled PT services to increase L knee ROM/ strength to improve pain-free mobility.    OBJECTIVE IMPAIRMENTS: Abnormal gait, decreased activity tolerance, decreased balance, decreased endurance, decreased mobility, difficulty walking, decreased ROM, decreased strength, hypomobility, improper body mechanics, postural  dysfunction, and pain.   ACTIVITY LIMITATIONS: carrying, lifting, bending, standing, squatting, stairs, transfers, and locomotion level  PARTICIPATION LIMITATIONS: meal prep and community activity  PERSONAL FACTORS: Fitness and Past/current experiences are also affecting patient's functional outcome.   REHAB POTENTIAL: Good  CLINICAL DECISION MAKING: Stable/uncomplicated  EVALUATION COMPLEXITY: Low   GOALS: Goals reviewed with patient? Yes  SHORT TERM GOALS: Target date: 11/12/21 Pt. Independent with HEP to increase L knee active flexion to 138 deg. To improve pain-free mobility.   Baseline:  see above.  11/19/21: 135 deg.  Goal status: Partially met   LONG TERM GOALS: Target date: 12/03/21  Pt. Will increase FOTO to 70 to improve pain-free mobility.   Baseline:  initial 61.  11/13: 63 Goal status: Not met  2.  Pt. Will increase L LE muscle strength 1/2 muscle grade to improve walking/ daily activity.   Baseline:  see above Goal status: INITIAL  3.  Pt. Will report 2/10 L knee pain at worst with daily exercise/ walking to daughter's house.   Baseline: >5/10 L knee pain.  11/13: 2-3/10 at worst with stairs. Goal status: Goal met   PLAN:  PT FREQUENCY: 1-2x/week  PT DURATION: 6 weeks  PLANNED INTERVENTIONS: Therapeutic exercises, Therapeutic activity, Neuromuscular re-education, Balance training, Gait training, Patient/Family education, Self Care, Joint mobilization, Stair training, Electrical stimulation, Cryotherapy, Moist heat, Manual therapy, and  Re-evaluation  PLAN FOR NEXT SESSION: Check goals/ schedule.     Pura Spice, PT, DPT # [phone] 11/19/2021, 12:32 PM

## 2021-11-22 ENCOUNTER — Other Ambulatory Visit: Payer: Self-pay | Admitting: Internal Medicine

## 2021-11-22 DIAGNOSIS — Z1231 Encounter for screening mammogram for malignant neoplasm of breast: Secondary | ICD-10-CM

## 2021-12-03 ENCOUNTER — Ambulatory Visit: Payer: Medicare HMO | Admitting: Physical Therapy

## 2021-12-03 DIAGNOSIS — M6281 Muscle weakness (generalized): Secondary | ICD-10-CM

## 2021-12-03 DIAGNOSIS — M25562 Pain in left knee: Secondary | ICD-10-CM | POA: Diagnosis not present

## 2021-12-03 DIAGNOSIS — M25662 Stiffness of left knee, not elsewhere classified: Secondary | ICD-10-CM

## 2021-12-03 DIAGNOSIS — G8929 Other chronic pain: Secondary | ICD-10-CM

## 2021-12-03 NOTE — Therapy (Signed)
OUTPATIENT PHYSICAL THERAPY LOWER EXTREMITY TREATMENT   Patient Name: Karina Hoffman MRN: [id number] DOB:12-05-1945, 76 y.o., female Today's Date: 12/03/21   PT End of Session - 12/03/21 0947     Visit Number 6    Number of Visits 13    Date for PT Re-Evaluation 12/03/21    PT Start Time 0947    PT Stop Time 1029    PT Time Calculation (min) 42 min             Past Medical History:  Diagnosis Date   Arthritis    Cancer ([place])    Melanoma   Diverticulosis    Fibrocystic breast disease    Hyperlipidemia    Hypertension    Stroke [facility])    Uterine fibroid    Past Surgical History:  Procedure Laterality Date   ANKLE ARTHROSCOPY W/ OPEN REPAIR     CARPAL TUNNEL RELEASE Right    COLONOSCOPY     COLONOSCOPY N/A 08/30/2020   Procedure: COLONOSCOPY;  Surgeon: Toledo, Benay Pike, MD;  Location: [facility] ENDOSCOPY;  Service: Gastroenterology;  Laterality: N/A;   COLONOSCOPY WITH PROPOFOL N/A 01/22/2017   Procedure: COLONOSCOPY WITH PROPOFOL;  Surgeon: Manya Silvas, MD;  Location: [facility] ENDOSCOPY;  Service: Endoscopy;  Laterality: N/A;   triger finger release Right    TUBAL LIGATION     There are no problems to display for this patient.   PCP: Idelle Crouch, MD   REFERRING PROVIDER: Fausto Skillern, PA-C  REFERRING DIAG: Iliotibial band tendinitis of left side  Primary osteoarthritis of left knee (lateral compartment)   THERAPY DIAG:  Chronic pain of left knee  Joint stiffness of knee, left  Muscle weakness (generalized)  Rationale for Evaluation and Treatment Rehabilitation  ONSET DATE: 04/07/21  SUBJECTIVE:   SUBJECTIVE STATEMENT:     EVALUATION Pt. States she was diagnosed with RA 18 months ago.  Pt. Received a cortisone injection in L knee 3 months ago with minimal benefit.  Pt. Reports several episodes of L knee giving away and walks around 2 miles/ day and complete work-out video everyday.    PERTINENT HISTORY: Chief Complaint  Patient presents  with  Knee Pain  LEFT KNEE PAIN   History of Present Illness:   Karina Hoffman is a 76 y.o. female that presents to clinic today for evaluation and management of left knee pain. Patient does have a history of rheumatoid arthritis with positive rheumatoid factor. Currently taking methotrexate. Received injection to the left infrapatellar bursa on 09/27/2021.  Her pain began around 6 months ago. The pain is located along the anterolateral aspect of the knee. She describes her pain as sharp and intermittent. Reports start up pain and stiffness. She reports associated feeling of giving way but no falls, some swelling in the knee. She denies associated locking. She reports minimal relief from the bursa injections.   Past Medical, Surgical, Family, Social History, Allergies, Medications:  Past Medical History:  Past Medical History:  Diagnosis Date  Carpal tunnel syndrome  Diverticulosis  Fibrocystic breast disease  Hyperlipidemia  Hypertension  Menopausal syndrome  Other nonspecific abnormal finding  S/P trigger finger release  Uterine fibroid   Past Surgical History:  Past Surgical History:  Procedure Laterality Date  COLONOSCOPY 01/22/2017  Adenomatous Polyps, FH Colon Polyps (Sister): CBF 01/2020  COLONOSCOPY 08/30/2020  Diverticulosis/PHx CP/No repeat due to age/TKT  COLONOSCOPY 12/25/2011, 02/23/2002  FH Colon Polyps (Sister)  TUBAL LIGATION   Current Medications:  Current Outpatient Medications  Medication Sig Dispense Refill  aspirin 81 MG EC tablet Take 81 mg by mouth once daily.  calcium carbonate 500 mg calcium (1,250 mg) tablet Take 500 mg of elemental by mouth once daily.  ezetimibe (ZETIA) 10 mg tablet Take 1 tablet by mouth once daily 90 tablet 0  folic acid (FOLVITE) 1 MG tablet TAKE 1 TABLET BY MOUTH EVERY DAY 90 tablet 1  meloxicam (MOBIC) 7.5 MG tablet Take 7.5 mg by mouth once daily as needed for Pain  methotrexate (RHEUMATREX) 2.5 MG tablet TAKE 6 TABLETS (15  MG TOTAL) BY MOUTH EVERY 7 (SEVEN) DAYS WITH A MEAL 72 tablet 1  propranoloL (INDERAL LA) 60 MG LA capsule Take 1 capsule by mouth once daily 90 capsule 0  traZODone (DESYREL) 50 MG tablet TAKE 1 TABLET BY MOUTH EVERYDAY AT BEDTIME 90 tablet 1  Patient ambulates unassisted. Patient able to actively flex the left hip. No pain with passive internal and external rotation of the hip.   left Knee Edema: Mild Effusion: None Patella push test: no crepitus and mild pain  Tenderness: Medial joint line and Lateral joint line , over Gerdy's tubercle Stability: lateral pseudolaxity Anterior drawer: no laxity  Posterior drawer: no laxity  McMurray: weakly positive laterally ROM: 0/0/138  The patient is able to plantarflex and dorsiflex the left ankle.   Patient is able to flex and extend the left hallux. Sensation is intact over the saphenous, lateral sural cutaneous, superficial fibular, and deep fibular nerve distributions. Dorsalis pedis, posterior tibialis 2+.  Tests Performed/Reviewed:   X-ray knee left 3 views  Result Date: 10/16/2021 Anteroposterior, lateral, and sunrise views of the left knee were obtained. Images reveal mild loss of lateral compartment joint space with mild osteophyte formation noted. Medial compartment appears relatively well-preserved. Mild loss of patellofemoral joint space noted.   PA flex view of the left knee shows moderate loss of lateral compartment joint space with mild osteophyte formation.   PAIN:  Are you having pain? Yes: NPRS scale: 0/10 Pain location: L knee Pain description: aching Aggravating factors: increase activity/ walking Relieving factors: rest  PRECAUTIONS: None  WEIGHT BEARING RESTRICTIONS: No  FALLS:  Has patient fallen in last 6 months? No  LIVING ENVIRONMENT: Lives with: lives alone Lives in: House/apartment Has following equipment at home: None  OCCUPATION: Retired.  Lives close to sister and daughter.    PLOF:  Independent  PATIENT GOALS: Increase L knee ROM/ strength to improve pain-free mobility/walking.     OBJECTIVE:   DIAGNOSTIC FINDINGS: See above MD note  PATIENT SURVEYS:  FOTO initial 61/ goal 64  COGNITION: Overall cognitive status: Within functional limits for tasks assessed     SENSATION: WFL  EDEMA:  Circumferential: L/R knee joint line (39/38 cm.), mid-calf (35.5/36 dm.), distal quad (41/41 cm).  MUSCLE LENGTH: Hamstrings: Right 67 deg; Left 67 deg  POSTURE: rounded shoulders (slight)  PALPATION: Moderate L knee warmth as compared to R knee.  L lateral knee tenderness.  (+) L ITB tenderness noted with good ROM as compared to R.    LOWER EXTREMITY ROM:  B LE AROM WNL except L knee AROM (131 deg. Guarded/pain).  R knee flexion 138 deg.  B knee extension 0 deg.   LOWER EXTREMITY MMT:  MMT Right eval Left eval  Hip flexion 4+ 4  Hip extension 4+ 4+  Hip abduction    Hip adduction 4 4  Hip internal rotation 4+ 4+  Hip external rotation 4+ 4+  Knee flexion 5  4+  Knee extension 5 4 pain  Ankle dorsiflexion 5 5  Ankle plantarflexion    Ankle inversion    Ankle eversion     (Blank rows = not tested)  LOWER EXTREMITY SPECIAL TESTS:  Knee special tests: Anterior drawer test: negative, Posterior drawer test: negative, McMurray's test: negative, and Steinmanns test: negative  (-) Ober test (bilateral).  (-) Fair test.  FUNCTIONAL TESTS:  5 times sit to stand: TBD  GAIT: Distance walked: in clinic Assistive device utilized: None Level of assistance: Complete Independence Comments: Slight L antalgic gait noted with initial steps after standing   11/27: Seated L hamstring 4+/5 MMT as compared R 5/5 MMT.  B knee extension 5/5 MMT.     TODAY'S TREATMENT    DATE: 12/03/21  Subjective:  Pt. Reports 3/10 L knee pain at this time.  Pt. Reports descending stairs is pain limiting and she prefers step to gait pattern.  Pt. Had f/u with Dr. Doy Hutching last week and was  referred to Dr. Marry Guan on 01/17/22.    There.ex.:  Nustep L5-6 10 min. B UE/LE (warm-up)- discussed Dr. Doy Hutching appts. And upcoming appt. With Dr. Marry Guan.    Tandem gait (good balance).  Walking in hallway with instruction to stay in center of hallway.  Good heel strike/ step pattern/ arm swing.  Walking alt. UE/LE touches in //-bars (forward/ backwards).  Lateral walking with slight knee flexion 3 laps.    STS from gray chair with no UE assist 10x.    Reviewed HEP  Manual tx.:  Supine L LE generalized stretches with focus on distal hamstring/ quad muscle/ ITB.  Supine L patellar mobs (all plane)- 5x 10 sec. Each.    No tib/fib mobs today.      PATIENT EDUCATION:  Education details: Access code: [email or web address] Person educated: Patient Education method: Explanation, Demonstration, and Handouts Education comprehension: verbalized understanding and returned demonstration  HOME EXERCISE PROGRAM:  Access Code: [email or web address] URL: [email or web address] Date: 10/29/2021 Prepared by: Dorcas Carrow  Exercises - Supine Hamstring Stretch  - 1 x daily - 7 x weekly - 1 sets - 3 reps - 20 seconds hold - Supine ITB Stretch  - 1 x daily - 7 x weekly - 1 sets - 3 reps - 20 seconds hold - Supine Piriformis Stretch with Leg Straight  - 1 x daily - 7 x weekly - 1 sets - 3 reps - 20 seconds hold - Sidelying Hip Abduction  - 1 x daily - 5 x weekly - 2 sets - 10 reps - Hooklying Clamshell with Resistance  - 1 x daily - 5 x weekly - 1 sets - 20 reps - Supine March with Resistance Band  - 1 x daily - 5 x weekly - 1 sets - 20 reps  ASSESSMENT:  CLINICAL IMPRESSION: Patient continues to present with moderate L knee warmth and joint line tenderness with palpation.  Pt. Demonstrates good understanding of HEP/ stretching and able to complete resisted there.ex. with no increase pain.  No increase c/o L knee pain with good quad muscle activation with supine ex.  Pt. Understands HEP and will continue  with daily walking/ activity.  Pt. Will f/u with Dr. Marry Guan in January and instructed to contact PT will any questions.  Probable discharge from PT if no issues.    OBJECTIVE IMPAIRMENTS: Abnormal gait, decreased activity tolerance, decreased balance, decreased endurance, decreased mobility, difficulty walking, decreased ROM, decreased strength, hypomobility, improper body mechanics, postural dysfunction, and pain.   ACTIVITY LIMITATIONS:  carrying, lifting, bending, standing, squatting, stairs, transfers, and locomotion level  PARTICIPATION LIMITATIONS: meal prep and community activity  PERSONAL FACTORS: Fitness and Past/current experiences are also affecting patient's functional outcome.   REHAB POTENTIAL: Good  CLINICAL DECISION MAKING: Stable/uncomplicated  EVALUATION COMPLEXITY: Low   GOALS: Goals reviewed with patient? Yes  SHORT TERM GOALS: Target date: 11/12/21 Pt. Independent with HEP to increase L knee active flexion to 138 deg. To improve pain-free mobility.   Baseline:  see above.  11/19/21: 135 deg.  Goal status: Partially met   LONG TERM GOALS: Target date: 12/03/21  Pt. Will increase FOTO to 70 to improve pain-free mobility.   Baseline:  initial 61.  11/13: 63 Goal status: Not met  2.  Pt. Will increase L LE muscle strength 1/2 muscle grade to improve walking/ daily activity.   Baseline:  see above Goal status: INITIAL  3.  Pt. Will report 2/10 L knee pain at worst with daily exercise/ walking to daughter's house.   Baseline: >5/10 L knee pain.  11/13: 2-3/10 at worst with stairs. Goal status: Goal met   PLAN:  PT FREQUENCY: 1-2x/week  PT DURATION: 6 weeks  PLANNED INTERVENTIONS: Therapeutic exercises, Therapeutic activity, Neuromuscular re-education, Balance training, Gait training, Patient/Family education, Self Care, Joint mobilization, Stair training, Electrical stimulation, Cryotherapy, Moist heat, Manual therapy, and Re-evaluation  PLAN FOR NEXT  SESSION: Pt. Instructed to contact PT if any questions or regression in symptoms prior to Dr. Marry Guan appt.      Pura Spice, PT, DPT # [phone] 12/03/2021, 12:33 PM

## 2022-01-21 ENCOUNTER — Ambulatory Visit
Admission: RE | Admit: 2022-01-21 | Discharge: 2022-01-21 | Disposition: A | Discharge status: Home / Self Care | Payer: Medicare HMO | Source: Ambulatory Visit | Attending: Internal Medicine | Admitting: Internal Medicine

## 2022-01-21 DIAGNOSIS — Z1231 Encounter for screening mammogram for malignant neoplasm of breast: Secondary | ICD-10-CM | POA: Insufficient documentation

## 2022-08-16 ENCOUNTER — Other Ambulatory Visit: Payer: Self-pay | Admitting: Family Medicine

## 2022-08-16 DIAGNOSIS — H539 Unspecified visual disturbance: Secondary | ICD-10-CM

## 2022-08-16 DIAGNOSIS — R519 Headache, unspecified: Secondary | ICD-10-CM

## 2022-08-16 DIAGNOSIS — Z8673 Personal history of transient ischemic attack (TIA), and cerebral infarction without residual deficits: Secondary | ICD-10-CM

## 2022-08-21 ENCOUNTER — Ambulatory Visit
Admission: RE | Admit: 2022-08-21 | Discharge: 2022-08-21 | Disposition: A | Discharge status: Home / Self Care | Payer: Medicare HMO | Source: Ambulatory Visit | Attending: Family Medicine | Admitting: Family Medicine

## 2022-08-21 DIAGNOSIS — R519 Headache, unspecified: Secondary | ICD-10-CM | POA: Insufficient documentation

## 2022-08-21 DIAGNOSIS — Z8673 Personal history of transient ischemic attack (TIA), and cerebral infarction without residual deficits: Secondary | ICD-10-CM | POA: Diagnosis present

## 2022-08-21 DIAGNOSIS — H539 Unspecified visual disturbance: Secondary | ICD-10-CM | POA: Diagnosis present

## 2022-08-21 MED ORDER — GADOBUTROL 1 MMOL/ML IV SOLN
6.0000 mL | Freq: Once | INTRAVENOUS | Status: AC | PRN
  Administered 2022-08-21: 6 mL via INTRAVENOUS

## 2022-10-03 ENCOUNTER — Other Ambulatory Visit: Payer: Self-pay | Admitting: Internal Medicine

## 2022-10-03 DIAGNOSIS — Z1231 Encounter for screening mammogram for malignant neoplasm of breast: Secondary | ICD-10-CM

## 2023-01-13 ENCOUNTER — Other Ambulatory Visit (INDEPENDENT_AMBULATORY_CARE_PROVIDER_SITE_OTHER): Payer: Self-pay | Admitting: Podiatry

## 2023-01-13 DIAGNOSIS — I739 Peripheral vascular disease, unspecified: Secondary | ICD-10-CM

## 2023-02-04 ENCOUNTER — Ambulatory Visit
Admission: RE | Admit: 2023-02-04 | Discharge: 2023-02-04 | Disposition: A | Discharge status: Home / Self Care | Payer: Medicare HMO | Source: Ambulatory Visit | Attending: Internal Medicine | Admitting: Internal Medicine

## 2023-02-04 DIAGNOSIS — Z1231 Encounter for screening mammogram for malignant neoplasm of breast: Secondary | ICD-10-CM | POA: Diagnosis present

## 2023-02-15 DIAGNOSIS — I10 Essential (primary) hypertension: Secondary | ICD-10-CM | POA: Insufficient documentation

## 2023-02-15 DIAGNOSIS — I70219 Atherosclerosis of native arteries of extremities with intermittent claudication, unspecified extremity: Secondary | ICD-10-CM | POA: Insufficient documentation

## 2023-02-15 NOTE — Progress Notes (Signed)
MRN : [id number]  Karina Hoffman is a 78 y.o. (13-Jun-1945) female who presents with chief complaint of check circulation.  History of Present Illness:    The patient is seen for evaluation of painful lower extremities and diminished pulses. Patient notes the pain is always associated with activity and is very consistent day today. Typically, the pain occurs at less than one block, progress is as activity continues to the point that the patient must stop walking. Resting including standing still for several minutes allows the patient to walk a similar distance before being forced to stop again. Uneven terrain and inclines shorten the distance. The pain has been progressive over the past several years. The patient denies any abrupt changes in claudication symptoms.  The patient states the inability to walk is causing problems with daily activities.  The patient denies rest pain or dangling of an extremity off the side of the bed during the night for relief. No open wounds or sores at this time. No prior interventions or surgeries.  No history of back problems or DJD of the lumbar sacral spine.   The patient's blood pressure has been stable and relatively well controlled. The patient denies amaurosis fugax or recent TIA symptoms. There are no recent neurological changes noted. The patient denies history of DVT, PE or superficial thrombophlebitis. The patient denies recent episodes of angina or shortness of breath.   ABI's Rt=1.25 and Lt=1.17 (triphasic)   No outpatient medications have been marked as taking for the 02/17/23 encounter (Appointment) with Gilda Crease, Latina Craver, MD.    Past Medical History:  Diagnosis Date   Arthritis    Cancer [facility])    Melanoma   Diverticulosis    Fibrocystic breast disease    Hyperlipidemia    Hypertension    Stroke [facility])    Uterine fibroid     Past Surgical History:  Procedure  Laterality Date   ANKLE ARTHROSCOPY W/ OPEN REPAIR     CARPAL TUNNEL RELEASE Right    COLONOSCOPY     COLONOSCOPY N/A 08/30/2020   Procedure: COLONOSCOPY;  Surgeon: Toledo, Boykin Nearing, MD;  Location: [facility] ENDOSCOPY;  Service: Gastroenterology;  Laterality: N/A;   COLONOSCOPY WITH PROPOFOL N/A 01/22/2017   Procedure: COLONOSCOPY WITH PROPOFOL;  Surgeon: Scot Jun, MD;  Location: [facility] ENDOSCOPY;  Service: Endoscopy;  Laterality: N/A;   triger finger release Right    TUBAL LIGATION      Social History Social History   Tobacco Use   Smoking status: Never   Smokeless tobacco: Never  Vaping Use   Vaping status: Never Used  Substance Use Topics   Alcohol use: Never   Drug use: Never    Family History Family History  Problem Relation Age of Onset   Breast cancer Neg Hx     No Known Allergies   REVIEW OF SYSTEMS (Negative unless checked)  Constitutional: [] Weight loss  [] Fever  [] Chills Cardiac: [] Chest pain   [] Chest pressure   [] Palpitations   [] Shortness of breath when laying flat   [] Shortness of breath with exertion. Vascular:  [x] Pain in legs with  walking   [] Pain in legs at rest  [] History of DVT   [] Phlebitis   [] Swelling in legs   [] Varicose veins   [] Non-healing ulcers Pulmonary:   [] Uses home oxygen   [] Productive cough   [] Hemoptysis   [] Wheeze  [] COPD   [] Asthma Neurologic:  [] Dizziness   [] Seizures   [] History of stroke   [] History of TIA  [] Aphasia   [] Vissual changes   [] Weakness or numbness in arm   [] Weakness or numbness in leg Musculoskeletal:   [] Joint swelling   [] Joint pain   [] Low back pain Hematologic:  [] Easy bruising  [] Easy bleeding   [] Hypercoagulable state   [] Anemic Gastrointestinal:  [] Diarrhea   [] Vomiting  [] Gastroesophageal reflux/heartburn   [] Difficulty swallowing. Genitourinary:  [] Chronic kidney disease   [] Difficult urination  [] Frequent urination   [] Blood in urine Skin:  [] Rashes   [] Ulcers  Psychological:  [] History of anxiety   []   History of major depression.  Physical Examination  There were no vitals filed for this visit. There is no height or weight on file to calculate BMI. Gen: WD/WN, NAD Head: [place]/AT, No temporalis wasting.  Ear/Nose/Throat: Hearing grossly intact, nares w/o erythema or drainage Eyes: PER, EOMI, sclera nonicteric.  Neck: Supple, no masses.  No bruit or JVD.  Pulmonary:  Good air movement, no audible wheezing, no use of accessory muscles.  Cardiac: RRR, normal S1, S2, no Murmurs. Vascular:  mild trophic changes, no open wounds Vessel Right Left  Radial Palpable Palpable  PT Trace Palpable Trace Palpable  DP Not Palpable Not Palpable  Gastrointestinal: soft, non-distended. No guarding/no peritoneal signs.  Musculoskeletal: M/S 5/5 throughout.  No visible deformity.  Neurologic: CN 2-12 intact. Pain and light touch intact in extremities.  Symmetrical.  Speech is fluent. Motor exam as listed above. Psychiatric: Judgment intact, Mood & affect appropriate for pt's clinical situation. Dermatologic: No rashes or ulcers noted.  No changes consistent with cellulitis.   CBC Lab Results  Component Value Date   WBC 5.1 06/05/2011   HGB 12.9 06/05/2011   HCT 39.0 06/05/2011   MCV 87 06/05/2011   PLT 259 06/05/2011    BMET    Component Value Date/Time   NA 140 06/05/2011 0756   K 4.2 06/05/2011 0756   CL 104 06/05/2011 0756   CO2 29 06/05/2011 0756   GLUCOSE 96 06/05/2011 0756   BUN 13 06/05/2011 0756   CREATININE 0.65 06/05/2011 0756   CALCIUM 9.4 06/05/2011 0756   GFRNONAA >60 06/05/2011 0756   GFRAA >60 06/05/2011 0756   CrCl cannot be calculated (Patient's most recent lab result is older than the maximum 21 days allowed.).  COAG No results found for: "INR", "PROTIME"  Radiology MM 3D SCREENING MAMMOGRAM BILATERAL BREAST Result Date: 02/05/2023 CLINICAL DATA:  Screening. EXAM: DIGITAL SCREENING BILATERAL MAMMOGRAM WITH TOMOSYNTHESIS AND CAD TECHNIQUE: Bilateral screening  digital craniocaudal and mediolateral oblique mammograms were obtained. Bilateral screening digital breast tomosynthesis was performed. The images were evaluated with computer-aided detection. COMPARISON:  Previous exam(s). ACR Breast Density Category b: There are scattered areas of fibroglandular density. FINDINGS: There are no findings suspicious for malignancy. IMPRESSION: No mammographic evidence of malignancy. A result letter of this screening mammogram will be mailed directly to the patient. RECOMMENDATION: Screening mammogram in one year. (Code:SM-B-01Y) BI-RADS CATEGORY  1: Negative. Electronically Signed   By: Baird Lyons M.D.   On: 02/05/2023 15:49     Assessment/Plan 1. Atherosclerosis of native artery of both lower extremities with intermittent claudication (HCC) (  Primary)  Recommend:  The patient has evidence of atherosclerosis of the lower extremities with claudication.  The patient does not voice lifestyle limiting changes at this point in time.  Noninvasive studies do not suggest clinically significant change.  No invasive studies, angiography or surgery at this time The patient should continue walking and begin a more formal exercise program.  The patient should continue antiplatelet therapy and aggressive treatment of the lipid abnormalities  No changes in the patient's medications at this time  Continued surveillance is indicated as atherosclerosis is likely to progress with time.    The patient will continue follow up with noninvasive studies as ordered.  - VAS [place] ABI WITH/WO TBI; Future  2. Essential hypertension Continue antihypertensive medications as already ordered, these medications have been reviewed and there are no changes at this time.    Levora Dredge, MD  02/15/2023 2:54 PM

## 2023-02-17 ENCOUNTER — Encounter (INDEPENDENT_AMBULATORY_CARE_PROVIDER_SITE_OTHER): Payer: Self-pay | Admitting: Vascular Surgery

## 2023-02-17 ENCOUNTER — Ambulatory Visit (INDEPENDENT_AMBULATORY_CARE_PROVIDER_SITE_OTHER): Payer: Medicare HMO

## 2023-02-17 ENCOUNTER — Ambulatory Visit (INDEPENDENT_AMBULATORY_CARE_PROVIDER_SITE_OTHER): Payer: Medicare HMO | Admitting: Vascular Surgery

## 2023-02-17 VITALS — BP 161/88 | HR 58 | Resp 18 | Ht 61.5 in | Wt 134.6 lb

## 2023-02-17 DIAGNOSIS — I70213 Atherosclerosis of native arteries of extremities with intermittent claudication, bilateral legs: Secondary | ICD-10-CM | POA: Diagnosis not present

## 2023-02-17 DIAGNOSIS — I10 Essential (primary) hypertension: Secondary | ICD-10-CM

## 2023-02-17 DIAGNOSIS — I739 Peripheral vascular disease, unspecified: Secondary | ICD-10-CM

## 2023-02-17 LAB — VAS US ABI WITH/WO TBI
Left ABI: 1.17
Right ABI: 1.25

## 2023-02-22 ENCOUNTER — Encounter (INDEPENDENT_AMBULATORY_CARE_PROVIDER_SITE_OTHER): Payer: Self-pay | Admitting: Vascular Surgery

## 2023-06-08 IMAGING — MG MM DIGITAL SCREENING BILAT W/ TOMO AND CAD
8 series · 8 of 24 positions shown · non-contrast
Comparison: Previous exam(s).

CLINICAL DATA: Screening.

EXAM:
DIGITAL SCREENING BILATERAL MAMMOGRAM WITH TOMOSYNTHESIS AND CAD
TECHNIQUE: Bilateral screening digital craniocaudal and mediolateral oblique
mammograms were obtained. Bilateral screening digital breast
tomosynthesis was performed. The images were evaluated with
computer-aided detection.

[R MLO synth-2D]
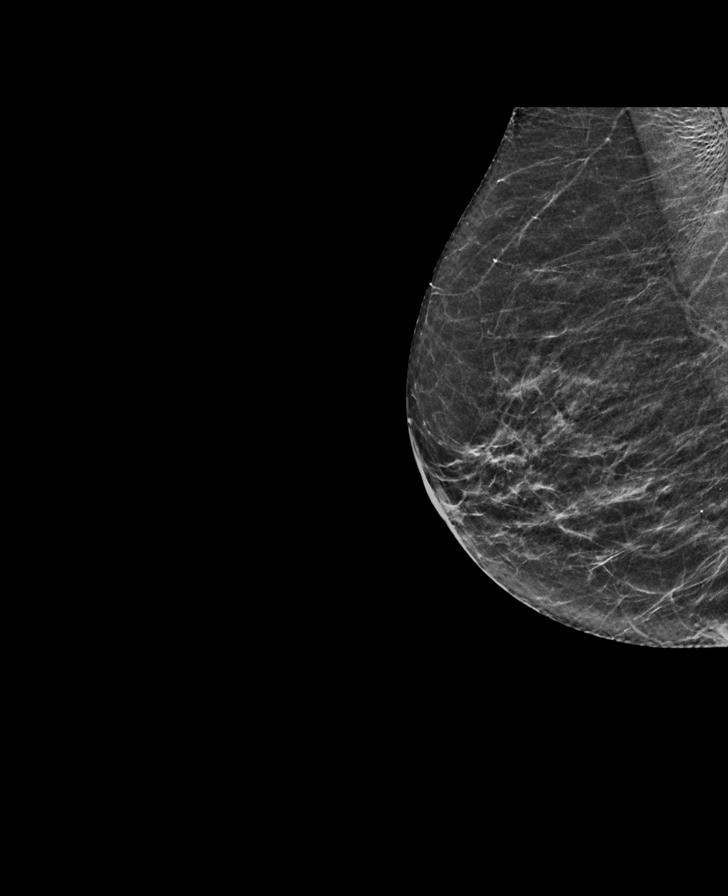

[L CC synth-2D]
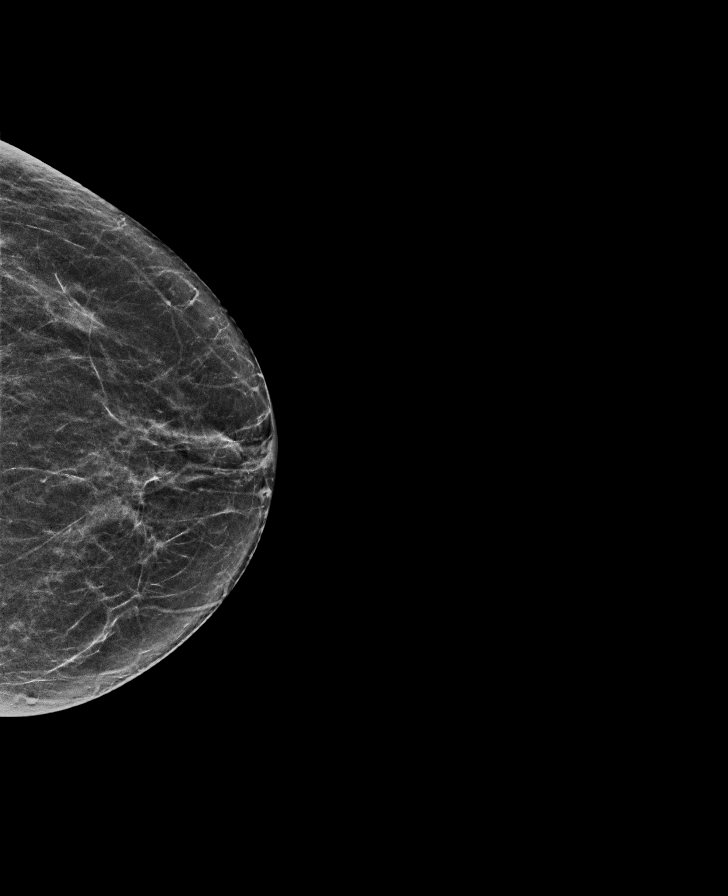

[L MLO synth-2D]
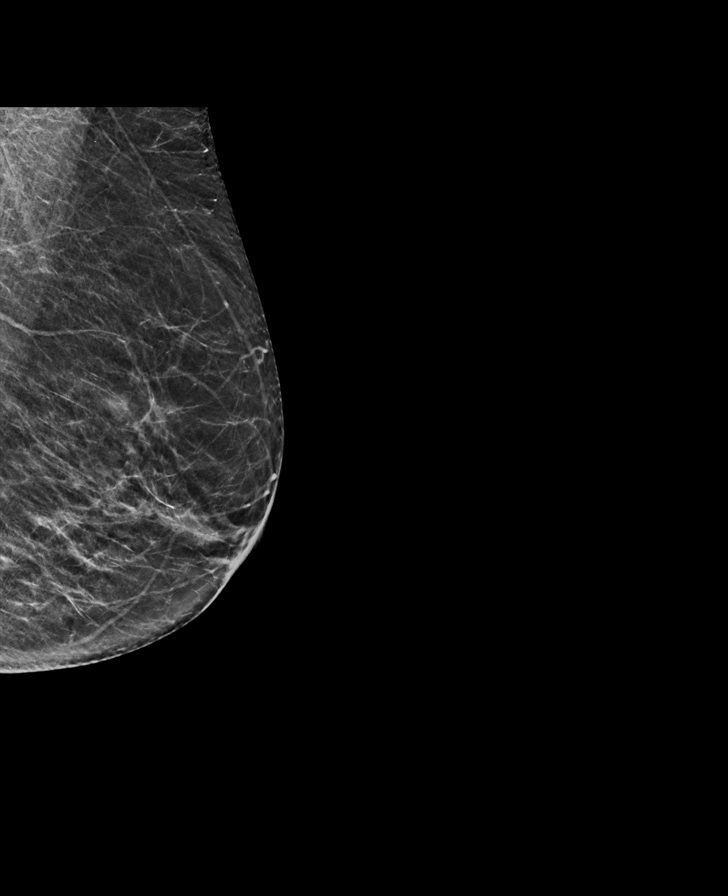

[R CC synth-2D]
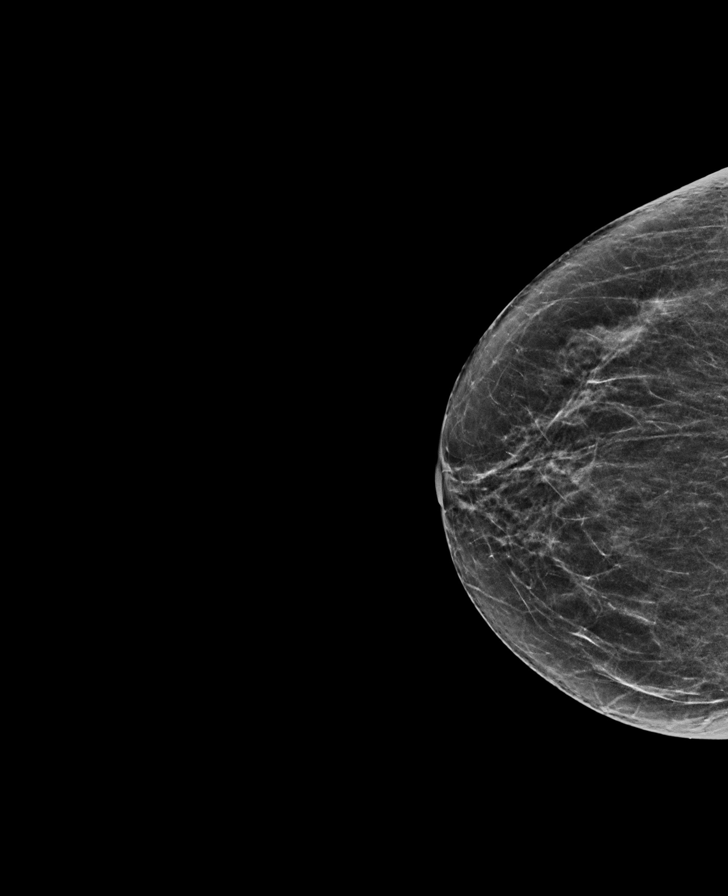

[L MLO tomo · tomo slice 28/55.0]
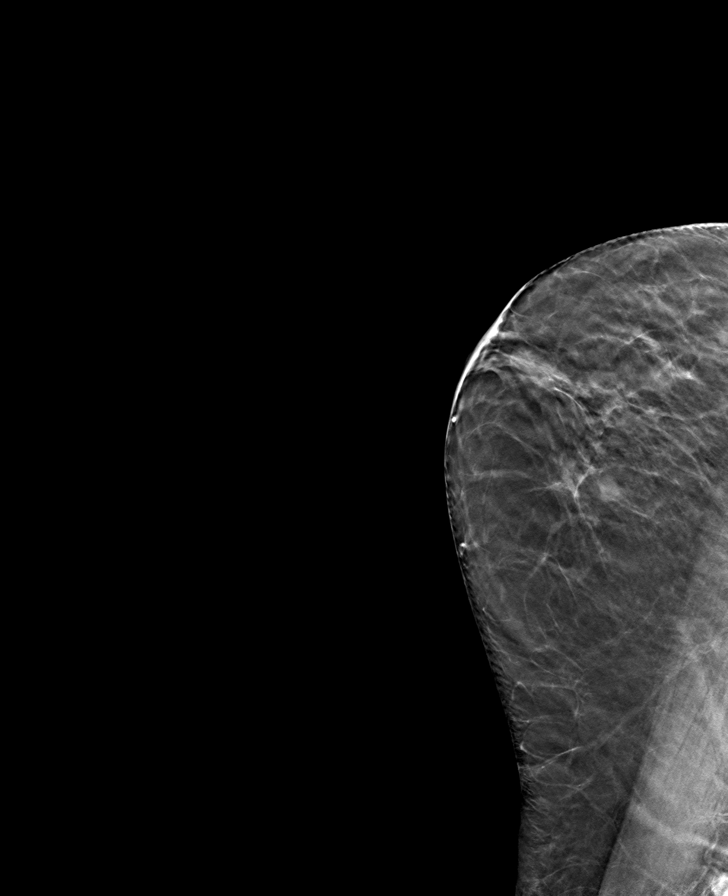

[L CC tomo · tomo slice 27/53.0]
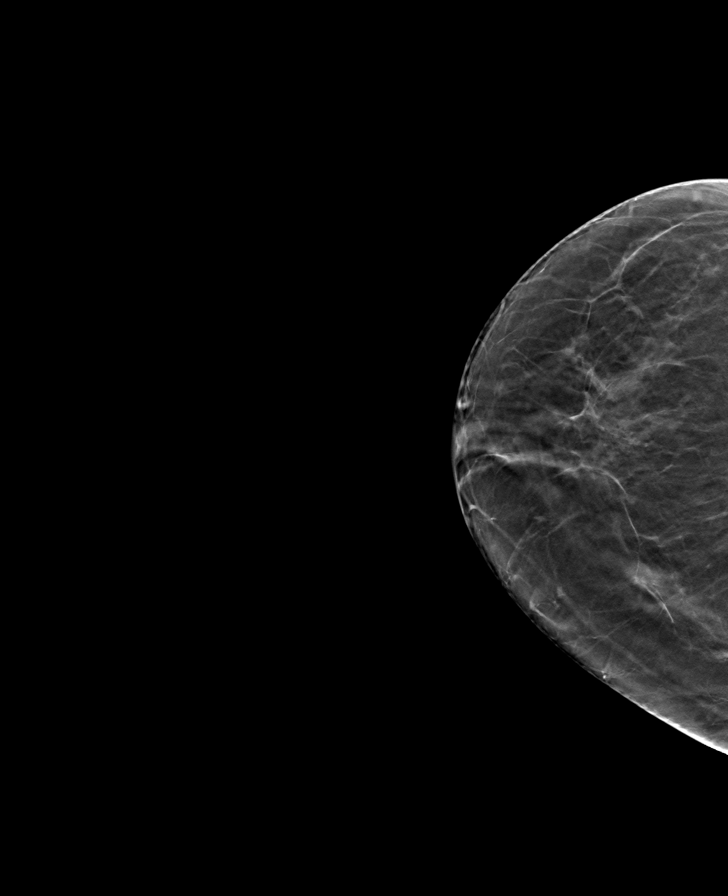

[R CC tomo · tomo slice 27/53.0]
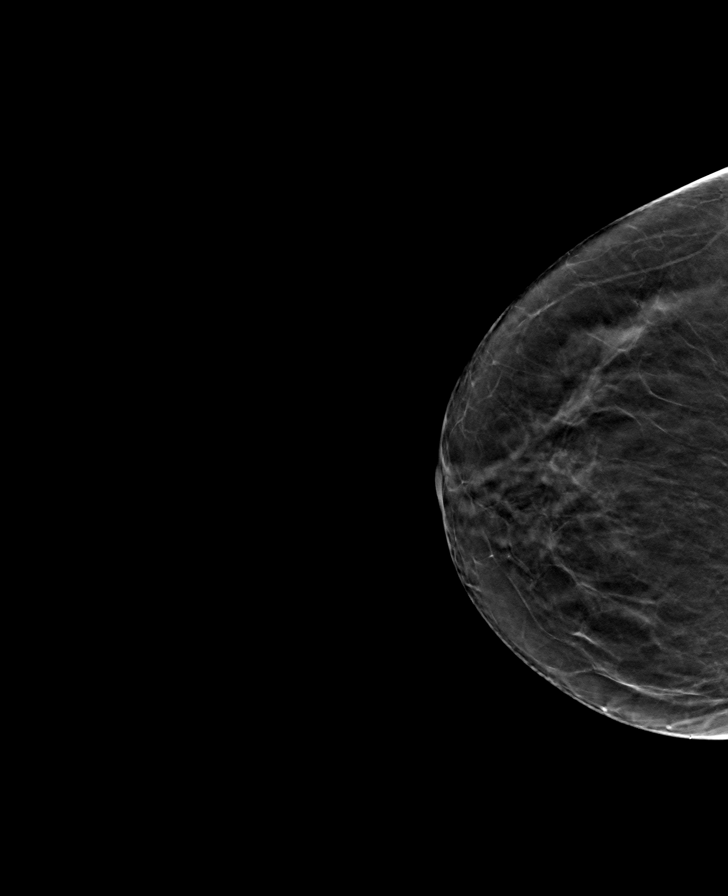

[R MLO tomo · tomo slice 26/51.0]
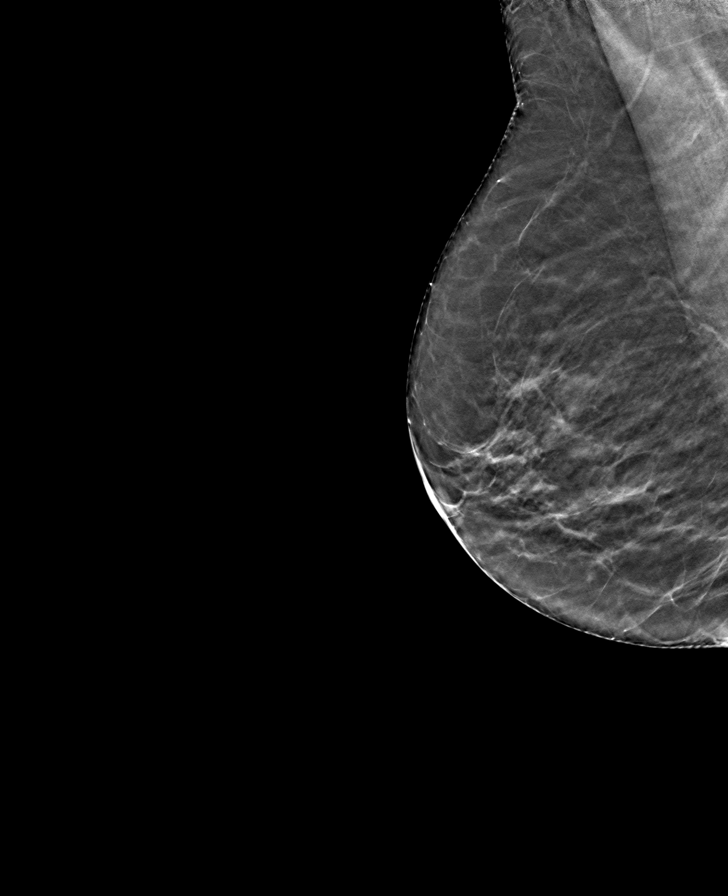

[8 of 24 positions shown; findings below may reference images not displayed]

ACR Breast Density Category b: There are scattered areas of
fibroglandular density.
FINDINGS: There are no findings suspicious for malignancy.
IMPRESSION: No mammographic evidence of malignancy. A result letter of this
screening mammogram will be mailed directly to the patient.

RECOMMENDATION:
Screening mammogram in one year. (Code:51-O-LD2)

BI-RADS CATEGORY  1: Negative.

## 2023-10-07 ENCOUNTER — Other Ambulatory Visit: Payer: Self-pay | Admitting: Internal Medicine

## 2023-10-07 DIAGNOSIS — Z1231 Encounter for screening mammogram for malignant neoplasm of breast: Secondary | ICD-10-CM

## 2024-02-09 ENCOUNTER — Encounter

## 2024-02-16 ENCOUNTER — Ambulatory Visit (INDEPENDENT_AMBULATORY_CARE_PROVIDER_SITE_OTHER): Payer: Medicare HMO | Admitting: Vascular Surgery

## 2024-02-16 ENCOUNTER — Encounter (INDEPENDENT_AMBULATORY_CARE_PROVIDER_SITE_OTHER): Payer: Medicare HMO

## 2024-02-27 ENCOUNTER — Encounter
